# Patient Record
Sex: Female | Born: 1970 | Hispanic: Yes | Marital: Single | Smoking: Current every day smoker
Health system: Southern US, Community
[De-identification: ages and names within clinical notes are randomized; demographics above are authoritative.]

## PROBLEM LIST (undated history)

## (undated) DIAGNOSIS — R002 Palpitations: Secondary | ICD-10-CM

## (undated) DIAGNOSIS — K8050 Calculus of bile duct without cholangitis or cholecystitis without obstruction: Secondary | ICD-10-CM

## (undated) DIAGNOSIS — F410 Panic disorder [episodic paroxysmal anxiety] without agoraphobia: Secondary | ICD-10-CM

## (undated) DIAGNOSIS — K819 Cholecystitis, unspecified: Secondary | ICD-10-CM

## (undated) HISTORY — DX: Cholecystitis, unspecified: K81.9

## (undated) HISTORY — DX: Calculus of bile duct without cholangitis or cholecystitis without obstruction: K80.50

---

## 2016-07-10 DIAGNOSIS — R002 Palpitations: Secondary | ICD-10-CM

## 2016-07-10 HISTORY — DX: Palpitations: R00.2

## 2016-11-30 ENCOUNTER — Encounter: Payer: Self-pay | Admitting: Emergency Medicine

## 2016-11-30 ENCOUNTER — Observation Stay: Payer: Medicaid Other | Admitting: Anesthesiology

## 2016-11-30 ENCOUNTER — Emergency Department: Payer: Medicaid Other

## 2016-11-30 ENCOUNTER — Encounter
Admission: EM | Disposition: A | Discharge status: Home / Self Care | Payer: Self-pay | Source: Home / Self Care | Attending: Emergency Medicine

## 2016-11-30 ENCOUNTER — Observation Stay
Admission: EM | Admit: 2016-11-30 | Discharge: 2016-12-01 | Disposition: A | Discharge status: Home / Self Care | Payer: Medicaid Other | Attending: General Surgery | Admitting: General Surgery

## 2016-11-30 DIAGNOSIS — K819 Cholecystitis, unspecified: Secondary | ICD-10-CM | POA: Diagnosis not present

## 2016-11-30 DIAGNOSIS — K8010 Calculus of gallbladder with chronic cholecystitis without obstruction: Secondary | ICD-10-CM | POA: Diagnosis not present

## 2016-11-30 DIAGNOSIS — K8050 Calculus of bile duct without cholangitis or cholecystitis without obstruction: Secondary | ICD-10-CM

## 2016-11-30 HISTORY — DX: Cholecystitis, unspecified: K81.9

## 2016-11-30 HISTORY — DX: Palpitations: R00.2

## 2016-11-30 HISTORY — PX: CHOLECYSTECTOMY: SHX55

## 2016-11-30 LAB — CBC
HEMATOCRIT: 41.8 % (ref 35.0–47.0)
HEMOGLOBIN: 14.4 g/dL (ref 12.0–16.0)
MCH: 29.8 pg (ref 26.0–34.0)
MCHC: 34.4 g/dL (ref 32.0–36.0)
MCV: 86.7 fL (ref 80.0–100.0)
Platelets: 260 10*3/uL (ref 150–440)
RBC: 4.83 MIL/uL (ref 3.80–5.20)
RDW: 16.7 % — AB (ref 11.5–14.5)
WBC: 9.6 10*3/uL (ref 3.6–11.0)

## 2016-11-30 LAB — LIPASE, BLOOD: LIPASE: 22 U/L (ref 11–51)

## 2016-11-30 LAB — PREGNANCY, URINE: Preg Test, Ur: NEGATIVE

## 2016-11-30 LAB — HEPATIC FUNCTION PANEL
ALBUMIN: 4.1 g/dL (ref 3.5–5.0)
ALK PHOS: 67 U/L (ref 38–126)
ALT: 16 U/L (ref 14–54)
AST: 27 U/L (ref 15–41)
Bilirubin, Direct: <0.1 mg/dL — ABNORMAL LOW (ref 0.1–0.5)
TOTAL PROTEIN: 7.6 g/dL (ref 6.5–8.1)
Total Bilirubin: 0.6 mg/dL (ref 0.3–1.2)

## 2016-11-30 LAB — SURGICAL PCR SCREEN
MRSA, PCR: NEGATIVE
Staphylococcus aureus: POSITIVE — AB

## 2016-11-30 LAB — BASIC METABOLIC PANEL
ANION GAP: 8 (ref 5–15)
BUN: 7 mg/dL (ref 6–20)
CHLORIDE: 109 mmol/L (ref 101–111)
CO2: 22 mmol/L (ref 22–32)
Calcium: 9.1 mg/dL (ref 8.9–10.3)
Creatinine, Ser: 0.68 mg/dL (ref 0.44–1.00)
GFR calc Af Amer: >60 mL/min (ref >60)
GFR calc non Af Amer: >60 mL/min (ref >60)
GLUCOSE: 109 mg/dL — AB (ref 65–99)
POTASSIUM: 3.8 mmol/L (ref 3.5–5.1)
Sodium: 139 mmol/L (ref 135–145)

## 2016-11-30 LAB — TROPONIN I: Troponin I: <0.03 ng/mL (ref <0.03)

## 2016-11-30 SURGERY — LAPAROSCOPIC CHOLECYSTECTOMY
Anesthesia: General

## 2016-11-30 MED ORDER — DEXTROSE IN LACTATED RINGERS 5 % IV SOLN
INTRAVENOUS | Status: DC
  Administered 2016-11-30: 06:00:00 via INTRAVENOUS

## 2016-11-30 MED ORDER — OXYCODONE HCL 5 MG PO TABS: 5.0000 mg | ORAL_TABLET | ORAL | Status: DC | PRN

## 2016-11-30 MED ORDER — ONDANSETRON HCL 4 MG/2ML IJ SOLN: 4.0000 mg | Freq: Four times a day (QID) | INTRAMUSCULAR | Status: DC | PRN

## 2016-11-30 MED ORDER — ROCURONIUM BROMIDE 50 MG/5ML IV SOLN
INTRAVENOUS | Status: AC
  Filled 2016-11-30: qty 1

## 2016-11-30 MED ORDER — FENTANYL CITRATE (PF) 100 MCG/2ML IJ SOLN
INTRAMUSCULAR | Status: AC
  Administered 2016-11-30: 25 ug via INTRAVENOUS
  Filled 2016-11-30: qty 2

## 2016-11-30 MED ORDER — DEXAMETHASONE SODIUM PHOSPHATE 10 MG/ML IJ SOLN
INTRAMUSCULAR | Status: AC
  Filled 2016-11-30: qty 1

## 2016-11-30 MED ORDER — ONDANSETRON HCL 4 MG/2ML IJ SOLN
INTRAMUSCULAR | Status: DC | PRN
  Administered 2016-11-30: 4 mg via INTRAVENOUS

## 2016-11-30 MED ORDER — MORPHINE SULFATE (PF) 4 MG/ML IV SOLN
4.0000 mg | INTRAVENOUS | Status: DC | PRN
  Administered 2016-11-30: 4 mg via INTRAVENOUS
  Filled 2016-11-30: qty 1

## 2016-11-30 MED ORDER — HYDRALAZINE HCL 20 MG/ML IJ SOLN: 10.0000 mg | INTRAMUSCULAR | Status: DC | PRN

## 2016-11-30 MED ORDER — ONDANSETRON HCL 4 MG/2ML IJ SOLN
4.0000 mg | Freq: Once | INTRAMUSCULAR | Status: AC
  Administered 2016-11-30: 4 mg via INTRAVENOUS
  Filled 2016-11-30: qty 2

## 2016-11-30 MED ORDER — PROPOFOL 10 MG/ML IV BOLUS
INTRAVENOUS | Status: AC
  Filled 2016-11-30: qty 20

## 2016-11-30 MED ORDER — LIDOCAINE HCL (PF) 2 % IJ SOLN
INTRAMUSCULAR | Status: AC
  Filled 2016-11-30: qty 2

## 2016-11-30 MED ORDER — BUPIVACAINE-EPINEPHRINE 0.25% -1:200000 IJ SOLN
INTRAMUSCULAR | Status: DC | PRN
  Administered 2016-11-30: 20 mL

## 2016-11-30 MED ORDER — MUPIROCIN 2 % EX OINT
1.0000 "application " | TOPICAL_OINTMENT | Freq: Two times a day (BID) | CUTANEOUS | Status: DC
  Administered 2016-11-30 (×2): 1 via NASAL
  Filled 2016-11-30: qty 22

## 2016-11-30 MED ORDER — KETOROLAC TROMETHAMINE 30 MG/ML IJ SOLN
INTRAMUSCULAR | Status: DC | PRN
  Administered 2016-11-30: 30 mg via INTRAVENOUS

## 2016-11-30 MED ORDER — DEXAMETHASONE SODIUM PHOSPHATE 10 MG/ML IJ SOLN
INTRAMUSCULAR | Status: DC | PRN
  Administered 2016-11-30: 10 mg via INTRAVENOUS

## 2016-11-30 MED ORDER — ONDANSETRON 4 MG PO TBDP: 4.0000 mg | ORAL_TABLET | Freq: Four times a day (QID) | ORAL | Status: DC | PRN

## 2016-11-30 MED ORDER — MORPHINE SULFATE (PF) 4 MG/ML IV SOLN
4.0000 mg | Freq: Once | INTRAVENOUS | Status: DC
  Filled 2016-11-30: qty 1

## 2016-11-30 MED ORDER — ROCURONIUM BROMIDE 100 MG/10ML IV SOLN
INTRAVENOUS | Status: DC | PRN
  Administered 2016-11-30: 10 mg via INTRAVENOUS
  Administered 2016-11-30: 40 mg via INTRAVENOUS

## 2016-11-30 MED ORDER — SUGAMMADEX SODIUM 200 MG/2ML IV SOLN
INTRAVENOUS | Status: DC | PRN
  Administered 2016-11-30: 180 mg via INTRAVENOUS

## 2016-11-30 MED ORDER — PROPOFOL 10 MG/ML IV BOLUS
INTRAVENOUS | Status: DC | PRN
  Administered 2016-11-30: 150 mg via INTRAVENOUS

## 2016-11-30 MED ORDER — FENTANYL CITRATE (PF) 100 MCG/2ML IJ SOLN
INTRAMUSCULAR | Status: AC
  Filled 2016-11-30: qty 2

## 2016-11-30 MED ORDER — ONDANSETRON HCL 4 MG/2ML IJ SOLN
INTRAMUSCULAR | Status: AC
  Filled 2016-11-30: qty 2

## 2016-11-30 MED ORDER — LACTATED RINGERS IV SOLN
INTRAVENOUS | Status: DC
  Administered 2016-11-30: 100 mL/h via INTRAVENOUS

## 2016-11-30 MED ORDER — DEXTROSE 5 % IV SOLN
2.0000 g | INTRAVENOUS | Status: DC
  Administered 2016-11-30 – 2016-12-01 (×2): 2 g via INTRAVENOUS
  Filled 2016-11-30 (×2): qty 2

## 2016-11-30 MED ORDER — SUGAMMADEX SODIUM 200 MG/2ML IV SOLN
INTRAVENOUS | Status: AC
  Filled 2016-11-30: qty 2

## 2016-11-30 MED ORDER — FENTANYL CITRATE (PF) 100 MCG/2ML IJ SOLN
25.0000 ug | INTRAMUSCULAR | Status: DC | PRN
  Administered 2016-11-30 (×4): 25 ug via INTRAVENOUS

## 2016-11-30 MED ORDER — CHLORHEXIDINE GLUCONATE CLOTH 2 % EX PADS
6.0000 | MEDICATED_PAD | Freq: Every day | CUTANEOUS | Status: DC
  Administered 2016-11-30: 6 via TOPICAL

## 2016-11-30 MED ORDER — DIPHENHYDRAMINE HCL 50 MG/ML IJ SOLN: 12.5000 mg | Freq: Four times a day (QID) | INTRAMUSCULAR | Status: DC | PRN

## 2016-11-30 MED ORDER — KETOROLAC TROMETHAMINE 30 MG/ML IJ SOLN
30.0000 mg | Freq: Four times a day (QID) | INTRAMUSCULAR | Status: DC
  Administered 2016-11-30 – 2016-12-01 (×4): 30 mg via INTRAVENOUS
  Filled 2016-11-30 (×4): qty 1

## 2016-11-30 MED ORDER — CEFAZOLIN SODIUM-DEXTROSE 2-4 GM/100ML-% IV SOLN: 2.0000 g | Freq: Once | INTRAVENOUS | Status: DC

## 2016-11-30 MED ORDER — MORPHINE SULFATE (PF) 4 MG/ML IV SOLN
4.0000 mg | Freq: Once | INTRAVENOUS | Status: AC
  Administered 2016-11-30: 4 mg via INTRAVENOUS
  Filled 2016-11-30: qty 1

## 2016-11-30 MED ORDER — ONDANSETRON HCL 4 MG/2ML IJ SOLN: 4.0000 mg | Freq: Once | INTRAMUSCULAR | Status: DC | PRN

## 2016-11-30 MED ORDER — HYDROMORPHONE HCL 1 MG/ML IJ SOLN: 0.5000 mg | INTRAMUSCULAR | Status: DC | PRN

## 2016-11-30 MED ORDER — MIDAZOLAM HCL 2 MG/2ML IJ SOLN
INTRAMUSCULAR | Status: AC
  Filled 2016-11-30: qty 2

## 2016-11-30 MED ORDER — BUPIVACAINE-EPINEPHRINE (PF) 0.25% -1:200000 IJ SOLN
INTRAMUSCULAR | Status: AC
  Filled 2016-11-30: qty 30

## 2016-11-30 MED ORDER — KETOROLAC TROMETHAMINE 30 MG/ML IJ SOLN
INTRAMUSCULAR | Status: AC
  Filled 2016-11-30: qty 1

## 2016-11-30 MED ORDER — DIPHENHYDRAMINE HCL 12.5 MG/5ML PO ELIX: 12.5000 mg | ORAL_SOLUTION | Freq: Four times a day (QID) | ORAL | Status: DC | PRN

## 2016-11-30 MED ORDER — PANTOPRAZOLE SODIUM 40 MG IV SOLR
40.0000 mg | Freq: Every day | INTRAVENOUS | Status: DC
  Administered 2016-11-30: 40 mg via INTRAVENOUS
  Filled 2016-11-30: qty 40

## 2016-11-30 MED ORDER — MIDAZOLAM HCL 2 MG/2ML IJ SOLN
INTRAMUSCULAR | Status: DC | PRN
  Administered 2016-11-30: 2 mg via INTRAVENOUS

## 2016-11-30 MED ORDER — CEFAZOLIN SODIUM-DEXTROSE 2-4 GM/100ML-% IV SOLN
INTRAVENOUS | Status: AC
  Administered 2016-11-30: 2000 mg
  Filled 2016-11-30: qty 100

## 2016-11-30 MED ORDER — FENTANYL CITRATE (PF) 100 MCG/2ML IJ SOLN
INTRAMUSCULAR | Status: DC | PRN
  Administered 2016-11-30 (×4): 50 ug via INTRAVENOUS

## 2016-11-30 SURGICAL SUPPLY — 43 items
APPLIER CLIP 5 13 M/L LIGAMAX5 (MISCELLANEOUS) ×3
BLADE SURG 15 STRL LF DISP TIS (BLADE) ×1 IMPLANT
BLADE SURG 15 STRL SS (BLADE) ×2
CANISTER SUCT 1200ML W/VALVE (MISCELLANEOUS) ×3 IMPLANT
CATH CHOLANGI 4FR 420404F (CATHETERS) IMPLANT
CHLORAPREP W/TINT 26ML (MISCELLANEOUS) ×3 IMPLANT
CLIP APPLIE 5 13 M/L LIGAMAX5 (MISCELLANEOUS) ×1 IMPLANT
CONRAY 60ML FOR OR (MISCELLANEOUS) ×3 IMPLANT
DERMABOND ADVANCED (GAUZE/BANDAGES/DRESSINGS) ×2
DERMABOND ADVANCED .7 DNX12 (GAUZE/BANDAGES/DRESSINGS) ×1 IMPLANT
DRAPE C-ARM XRAY 36X54 (DRAPES) IMPLANT
ELECT REM PT RETURN 9FT ADLT (ELECTROSURGICAL) ×3
ELECTRODE REM PT RTRN 9FT ADLT (ELECTROSURGICAL) ×1 IMPLANT
ENDOPOUCH RETRIEVER 10 (MISCELLANEOUS) ×3 IMPLANT
FILTER LAP SMOKE EVAC STRL (MISCELLANEOUS) ×3 IMPLANT
GLOVE SURG SYN 7.0 (GLOVE) ×3 IMPLANT
GLOVE SURG SYN 7.5  E (GLOVE) ×2
GLOVE SURG SYN 7.5 E (GLOVE) ×1 IMPLANT
GOWN STRL REUS W/ TWL LRG LVL3 (GOWN DISPOSABLE) ×3 IMPLANT
GOWN STRL REUS W/TWL LRG LVL3 (GOWN DISPOSABLE) ×6
IRRIGATION STRYKERFLOW (MISCELLANEOUS) IMPLANT
IRRIGATOR STRYKERFLOW (MISCELLANEOUS)
IV CATH ANGIO 12GX3 LT BLUE (NEEDLE) ×3 IMPLANT
IV NS 1000ML (IV SOLUTION)
IV NS 1000ML BAXH (IV SOLUTION) IMPLANT
JACKSON PRATT 10 (INSTRUMENTS) IMPLANT
L-HOOK LAP DISP 36CM (ELECTROSURGICAL) ×3
LABEL OR SOLS (LABEL) ×3 IMPLANT
LHOOK LAP DISP 36CM (ELECTROSURGICAL) ×1 IMPLANT
NDL SAFETY 22GX1.5 (NEEDLE) ×3 IMPLANT
NEEDLE HYPO 22GX1.5 SAFETY (NEEDLE) ×3 IMPLANT
PACK LAP CHOLECYSTECTOMY (MISCELLANEOUS) ×3 IMPLANT
PENCIL ELECTRO HAND CTR (MISCELLANEOUS) ×3 IMPLANT
SCISSORS METZENBAUM CVD 33 (INSTRUMENTS) ×3 IMPLANT
SLEEVE ADV FIXATION 5X100MM (TROCAR) ×9 IMPLANT
SPONGE VERSALON 4X4 4PLY (MISCELLANEOUS) IMPLANT
SUT MNCRL 4-0 (SUTURE) ×2
SUT MNCRL 4-0 27XMFL (SUTURE) ×1
SUT VICRYL 0 AB UR-6 (SUTURE) ×3 IMPLANT
SUTURE MNCRL 4-0 27XMF (SUTURE) ×1 IMPLANT
TROCAR 130MM GELPORT  DAV (MISCELLANEOUS) ×3 IMPLANT
TROCAR Z-THREAD OPTICAL 5X100M (TROCAR) ×3 IMPLANT
TUBING INSUFFLATOR HI FLOW (MISCELLANEOUS) ×3 IMPLANT

## 2016-11-30 NOTE — Anesthesia Procedure Notes (Signed)
Procedure Name: Intubation Date/Time: 11/30/2016 12:35 PM Performed by: Aline Brochure Pre-anesthesia Checklist: Patient identified, Emergency Drugs available, Suction available and Patient being monitored Patient Re-evaluated:Patient Re-evaluated prior to inductionOxygen Delivery Method: Circle system utilized Preoxygenation: Pre-oxygenation with 100% oxygen Intubation Type: IV induction Ventilation: Mask ventilation without difficulty Laryngoscope Size: Mac and 3 Grade View: Grade I Tube type: Oral Tube size: 7.0 mm Number of attempts: 1 Placement Confirmation: ETT inserted through vocal cords under direct vision,  positive ETCO2 and breath sounds checked- equal and bilateral Secured at: 22 cm Tube secured with: Tape Dental Injury: Teeth and Oropharynx as per pre-operative assessment

## 2016-11-30 NOTE — Anesthesia Post-op Follow-up Note (Cosign Needed)
Anesthesia QCDR form completed.        

## 2016-11-30 NOTE — H&P (Signed)
Patient ID: Cathy Woods, female   DOB: 1971-03-14, 46 y.o.   MRN: [id number]  CC: ABDOMINAL PAIN  HPI Cathy Woods is a 46 y.o. female who presents to the emergency department with a one-day history of abdominal pain. Patient reports this is happen before and appears to be associated with overeating. She reports awakening with right upper quadrant pain. It has been severe in nature with a stabbing sensation. It has not radiated removed since it started. He continued to progress during the day which prompted her to seek medical care in the emergency department. She has had nausea but no vomiting with this. She has had subjective chills but no fevers. She denies chest pain, shortness of breath, diarrhea, constipation. She is otherwise in her usual state of health.  HPI  History reviewed. No pertinent past medical history. Patient currently on no medications.  History reviewed. No pertinent surgical history. Patient denies any prior abdominal history.  History reviewed. No pertinent family history. Patient reports diabetes in the distant family but denies any heart disease or cancer  Social History Social History  Substance Use Topics  . Smoking status: Never Smoker  . Smokeless tobacco: Never Used  . Alcohol use No    No Known Allergies  No current facility-administered medications for this encounter.    No current outpatient prescriptions on file.     Review of Systems A Multi-point review of systems was asked and was negative except for the findings documented in the history of present illness  Physical Exam Blood pressure 127/84, pulse 66, temperature 97.6 F (36.4 C), temperature source Oral, resp. rate 14, height 5\' 6"  (1.676 m), weight 86.2 kg (190 lb), last menstrual period 11/29/2016, SpO2 100 %. CONSTITUTIONAL: No acute distress. EYES: Pupils are equal, round, and reactive to light, Sclera are non-icteric. EARS, NOSE, MOUTH AND THROAT: The oropharynx is clear. The  oral mucosa is pink and moist. Hearing is intact to voice. LYMPH NODES:  Lymph nodes in the neck are normal. RESPIRATORY:  Lungs are clear. There is normal respiratory effort, with equal breath sounds bilaterally, and without pathologic use of accessory muscles. CARDIOVASCULAR: Heart is regular without murmurs, gallops, or rubs. GI: The abdomen is soft, tender to palpation in the right upper quadrant with a positive Murphy's sign, and nondistended. There are no palpable masses. There is no hepatosplenomegaly. There are normal bowel sounds in all quadrants. GU: Rectal deferred.   MUSCULOSKELETAL: Normal muscle strength and tone. No cyanosis or edema.   SKIN: Turgor is good and there are no pathologic skin lesions or ulcers. NEUROLOGIC: Motor and sensation is grossly normal. Cranial nerves are grossly intact. PSYCH:  Oriented to person, place and time. Affect is normal.  Data Reviewed Images and labs reviewed. Labs are all within normal limits to include a normal white blood cell count 9.6, normal LFTs with a bilirubin of 0.6, lipase, AST, ALT all within normal limits. Right upper quadrant ultrasound shows a 1.3 cm stone in the neck of the gallbladder. Questionable thickening of the gallbladder wall but no pericholecystic fluid or ductal dilatation. Positive Murphy sign on ultrasound. I have personally reviewed the patient's imaging, laboratory findings and medical records.    Assessment    Cholecystitis    Plan    46 year old female with concerning features of cholecystitis. Discussed the diagnosis in detail with the patient. Discussed treatment options of antibiotics, surgery, accommodation of both. Also discussed that timing of surgery would be dependent upon 04 and surgeon availability. She voiced  understanding. Plan to bring the patient and her under observation. Likely to undergo a laparoscopic cholecystectomy later today, likely with my partner Dr. Aleen CampiPiscoya. Patient voiced understanding and  accepts this plan.     Time spent with the patient was 45 minutes, with more than 50% of the time spent in face-to-face education, counseling and care coordination.     Ricarda Frameharles Lindy Pennisi, MD FACS General Surgeon 11/30/2016, 4:30 AM

## 2016-11-30 NOTE — ED Triage Notes (Signed)
Pt ambulatory to triage with steady slow gait, no distress noted. Pt c/o of right sided chest pain at rib area. Pt reports having same symptoms 1 week ago and pain went away, today pain has increased and is not going away. Pt denies N/V/SOB.

## 2016-11-30 NOTE — Anesthesia Preprocedure Evaluation (Signed)
Anesthesia Evaluation  Patient identified by MRN, date of birth, ID band Patient awake    Reviewed: Allergy & Precautions, NPO status , Patient's Chart, lab work & pertinent test results  History of Anesthesia Complications Negative for: history of anesthetic complications  Airway Mallampati: II       Dental   Pulmonary neg pulmonary ROS,           Cardiovascular negative cardio ROS       Neuro/Psych negative neurological ROS     GI/Hepatic negative GI ROS, Neg liver ROS,   Endo/Other  diabetes, Gestational  Renal/GU negative Renal ROS     Musculoskeletal   Abdominal   Peds  Hematology   Anesthesia Other Findings   Reproductive/Obstetrics                             Anesthesia Physical Anesthesia Plan  ASA: II  Anesthesia Plan: General   Post-op Pain Management:    Induction: Intravenous  Airway Management Planned: Oral ETT  Additional Equipment:   Intra-op Plan:   Post-operative Plan:   Informed Consent: I have reviewed the patients History and Physical, chart, labs and discussed the procedure including the risks, benefits and alternatives for the proposed anesthesia with the patient or authorized representative who has indicated his/her understanding and acceptance.     Plan Discussed with:   Anesthesia Plan Comments:         Anesthesia Quick Evaluation

## 2016-11-30 NOTE — Transfer of Care (Signed)
Immediate Anesthesia Transfer of Care Note  Patient: Tana CoastNoemy Record  Procedure(s) Performed: Procedure(s): LAPAROSCOPIC CHOLECYSTECTOMY (N/A)  Patient Location: PACU  Anesthesia Type:General  Level of Consciousness: sedated and responds to stimulation  Airway & Oxygen Therapy: Patient Spontanous Breathing and Patient connected to face mask oxygen  Post-op Assessment: Report given to RN and Post -op Vital signs reviewed and stable  Post vital signs: Reviewed and stable  Last Vitals:  Vitals:   11/30/16 1157 11/30/16 1408  BP: 124/79 (!) 102/59  Pulse: 64 74  Resp: 15 16  Temp: (!) 35.7 C 36.3 C    Last Pain:  Vitals:   11/30/16 1157  TempSrc: Tympanic  PainSc: 6       Patients Stated Pain Goal: 1 (11/30/16 1157)  Complications: No apparent anesthesia complications

## 2016-11-30 NOTE — Progress Notes (Signed)
11/30/2016  Subjective: Patient admitted overnight with unrelenting pain from biliary colic. There is no signs of cholecystitis on ultrasound or her labs but she did have a positive Murphy's sign and her pain was not improving and she continued to have nausea. This morning she reports that her pain is a little bit better and has not had nausea since last night.  Vital signs: Temp:  [97.6 F (36.4 C)-98.4 F (36.9 C)] 98.4 F (36.9 C) (05/24 0740) Pulse Rate:  [54-76] 54 (05/24 0740) Resp:  [11-20] 14 (05/24 0740) BP: (123-128)/(64-84) 123/64 (05/24 0740) SpO2:  [98 %-100 %] 100 % (05/24 0740) Weight:  [86.2 kg (190 lb)-88.5 kg (195 lb 0.2 oz)] 88.5 kg (195 lb 0.2 oz) (05/24 0510)   Intake/Output: No intake/output data recorded. Last BM Date: 11/29/16  Physical Exam: Constitutional: No acute distress Abdomen:  Soft, nondistended, with positive Murphy's sign and tenderness to palpation in the right upper quadrant and epigastric area.  Labs:   Recent Labs  11/30/16 0122  WBC 9.6  HGB 14.4  HCT 41.8  PLT 260    Recent Labs  11/30/16 0122  NA 139  K 3.8  CL 109  CO2 22  GLUCOSE 109*  BUN 7  CREATININE 0.68  CALCIUM 9.1   No results for input(s): LABPROT, INR in the last 72 hours.  Imaging: Dg Chest 2 View  Result Date: 11/30/2016 CLINICAL DATA:  Right-sided chest pain at the rib area. Similar symptoms 1 week ago. Today pain is increasing and not going away. Smoker. EXAM: CHEST  2 VIEW COMPARISON:  None. FINDINGS: The heart size and mediastinal contours are within normal limits. Both lungs are clear. The visualized skeletal structures are unremarkable. IMPRESSION: No active cardiopulmonary disease. Electronically Signed   By: Burman NievesWilliam  Stevens M.D.   On: 11/30/2016 01:53   [place] Abdomen Limited Ruq  Result Date: 11/30/2016 CLINICAL DATA:  Acute onset of right upper quadrant abdominal pain. Initial encounter. EXAM: US ABDOMEN LIMITED - RIGHT UPPER QUADRANT COMPARISON:   None. FINDINGS: Gallbladder: A 1.3 cm stone is lodged at the neck of the gallbladder. Mild gallbladder wall thickening is noted, measuring 3 mm. A positive ultrasonographic Murphy's sign is elicited, raising concern for intermittent obstruction due to the stone. No pericholecystic fluid is seen. Common bile duct: Diameter: 0.5 cm, within normal limits in caliber. Liver: No focal lesion identified. Within normal limits in parenchymal echogenicity. IMPRESSION: 1.3 cm stone lodged at the neck of the gallbladder. Mild gallbladder wall thickening noted. Positive ultrasonographic Murphy's sign raises concern for intermittent obstruction due to the stone. No definite evidence for acute cholecystitis. Electronically Signed   By: Roanna RaiderJeffery  Chang M.D.   On: 11/30/2016 03:44    Assessment/Plan: 46 year old female with symptomatic cholelithiasis with unrelenting pain.   -We'll take the patient to the operating room today for laparoscopic cholecystectomy. Discussed the risks of the procedure including risk of bleeding, infection, abscess formation, injury to surrounding structures as well as need for further procedures. The patient is willing to proceed and has given informed consent. The patient will go to the operating room today pending or availability. In the meantime she will continue to be nothing by mouth with IV fluid hydration and IV antibiotics and appropriate pain and nausea control.   Howie IllJose Luis Desmond Szabo, MD [facility]

## 2016-11-30 NOTE — Op Note (Signed)
  Procedure Date:  11/30/2016  Pre-operative Diagnosis:  Symptomatic cholelithiasis  Post-operative Diagnosis: Chronic cholecystitis  Procedure:  Laparoscopic cholecystectomy  Surgeon:  Howie IllJose Luis Lajuanna Pompa, MD  Anesthesia:  General endotracheal  Estimated Blood Loss:  10 ml  Specimens:  gallbladder  Complications:  None  Indications for Procedure:  This is a 46 y.o. female who presents with abdominal pain and workup revealing symptomatic cholelithiasis.  The benefits, complications, treatment options, and expected outcomes were discussed with the patient. The risks of bleeding, infection, recurrence of symptoms, failure to resolve symptoms, bile duct damage, bile duct leak, retained common bile duct stone, bowel injury, and need for further procedures were all discussed with the patient and she was willing to proceed.  Description of Procedure: The patient was correctly identified in the preoperative area and brought into the operating room.  The patient was placed supine with VTE prophylaxis in place.  Appropriate time-outs were performed.  Anesthesia was induced and the patient was intubated.  Appropriate antibiotics were infused.  The abdomen was prepped and draped in a sterile fashion. An infraumbilical incision was made. A cutdown technique was used to enter the abdominal cavity without injury, and a Hasson trocar was inserted.  Pneumoperitoneum was obtained with appropriate opening pressures.  A 5-mm port was placed in the subxiphoid area and two 5-mm ports were placed in the right upper quadrant under direct visualization.  The gallbladder was identified and found to be inflamed.  The fundus was grasped and retracted cephalad.  Adhesions were lysed bluntly and with electrocautery. The infundibulum was grasped and retracted laterally, exposing the peritoneum overlying the gallbladder.  This was incised with electrocautery and extended on either side of the gallbladder.  The cystic duct  and anterior and posterior cystic artery were clearly identified and bluntly dissected.  All were clipped twice proximally and once distally, cutting in between.  The gallbladder was taken from the gallbladder fossa in a retrograde fashion with electrocautery. The gallbladder was placed in an Endocatch bag and brought out via the umbilical incision. The liver bed was inspected and any bleeding was controlled with electrocautery. The right upper quadrant was then inspected again revealing intact clips, no bleeding, and no ductal injury.  The area was thoroughly irrigated.  The 5 mm ports were removed under direct visualization and the Hasson trocar was removed.  The fascial opening was closed using 0 vicryl suture.  Local anesthetic was infused in all incisions and the incisions were closed with 4-0 Monocryl.  The wounds were cleaned and sealed with DermaBond.  The patient was emerged from anesthesia and extubated and brought to the recovery room for further management.  The patient tolerated the procedure well and all counts were correct at the end of the case.   Howie IllJose Luis Merry Pond, MD

## 2016-11-30 NOTE — Anesthesia Postprocedure Evaluation (Signed)
Anesthesia Post Note  Patient: Cathy Woods  Procedure(s) Performed: Procedure(s) (LRB): LAPAROSCOPIC CHOLECYSTECTOMY (N/A)  Patient location during evaluation: PACU Anesthesia Type: General Level of consciousness: awake and alert Pain management: pain level controlled Vital Signs Assessment: post-procedure vital signs reviewed and stable Respiratory status: spontaneous breathing and respiratory function stable Cardiovascular status: stable Anesthetic complications: no     Last Vitals:  Vitals:   11/30/16 1505 11/30/16 1512  BP: 102/66 (!) 104/59  Pulse: 63 (!) 52  Resp: 17 14  Temp:      Last Pain:  Vitals:   11/30/16 1500  TempSrc:   PainSc: 2                  KEPHART,WILLIAM K

## 2016-11-30 NOTE — ED Provider Notes (Signed)
[facility] Emergency Department Provider Note  Time seen: 3:53 AM  I have reviewed the triage vital signs and the nursing notes.   HISTORY  Chief Complaint Chest Pain    HPI Cathy Woods is a 46 y.o. female with no past medical history who presents to the emergency department with right upper quadrant abdominal pain. According to the patient approximately one week ago she developed pain in the right upper quadrant and the abdomen lasted several hours and then went away. States she has been pain-free until this morning when she awoke with mild right upper quadrant pain which had progressively worsened throughout the day became severe this evening along with nausea associated with the emergency department for evaluation. Denies any fever. Denies any vomiting. Rates the pain as moderate to severe. Denies any pain in the chest or trouble breathing.  History reviewed. No pertinent past medical history.  There are no active problems to display for this patient.   History reviewed. No pertinent surgical history.  Prior to Admission medications   Not on File    No Known Allergies  History reviewed. No pertinent family history.  Social History Social History  Substance Use Topics  . Smoking status: Never Smoker  . Smokeless tobacco: Never Used  . Alcohol use No    Review of Systems Constitutional: Negative for fever. Cardiovascular: Negative for chest pain. Respiratory: Negative for shortness of breath. Gastrointestinal:Moderate abdominal pain. Positive for nausea. Genitourinary: Negative for dysuria. Musculoskeletal: Negative for back pain. Skin: Negative for rash. Neurological: Negative for headache All other ROS negative  ____________________________________________   PHYSICAL EXAM:  VITAL SIGNS: ED Triage Vitals [11/30/16 0118]  Enc Vitals Group     BP 128/81     Pulse Rate 76     Resp 17     Temp 97.6 F (36.4 C)     Temp Source  Oral     SpO2 99 %     Weight 190 lb (86.2 kg)     Height 5\' 6"  (1.676 m)     Head Circumference      Peak Flow      Pain Score      Pain Loc      Pain Edu?      Excl. in GC?     Constitutional: Alert and oriented. Well appearing and in no distress. Eyes: Normal exam ENT   Head: Normocephalic and atraumatic.   Mouth/Throat: Mucous membranes are moist. Cardiovascular: Normal rate, regular rhythm. No murmur Respiratory: Normal respiratory effort without tachypnea nor retractions. Breath sounds are clear  Gastrointestinal: Soft, moderate right upper quadrant tenderness palpation. No rebound or guarding. No distention. Musculoskeletal: Nontender with normal range of motion in all extremities.  Neurologic:  Normal speech and language. No gross focal neurologic deficits  Skin:  Skin is warm, dry and intact.  Psychiatric: Mood and affect are normal.   ____________________________________________    EKG  EKG reviewed and interpreted by myself shows sinus rhythm at 81 bpm, narrow QRS, normal axis, normal intervals, nonspecific ST changes without elevation.  ____________________________________________    RADIOLOGY  Chest x-ray negative. Ultrasound shows a 1.3 cm stone lodged in the neck of the gallbladder.  ____________________________________________   INITIAL IMPRESSION / ASSESSMENT AND PLAN / ED COURSE  Pertinent labs & imaging results that were available during my care of the patient were reviewed by me and considered in my medical decision making (see chart for details).  Patient presents with right upper quadrant abdominal  pain. Patient has moderate tenderness palpation in the right upper quadrant, mild distress due to pain. Labs including LFTs and lipase are normal. Troponin negative. Ultrasound of the right upper quadrant shows a 1.3 cm stone lodged in the gallbladder neck. I discussed the patient with general surgery who will be down to see the patient.  Dr.  Chrissie Noa has seen the patient will be admitting to his service for symptomatic biliary colic  ____________________________________________   FINAL CLINICAL IMPRESSION(S) / ED DIAGNOSES  Right upper quadrant pain Biliary colic   Minna Antis, MD 11/30/16 [phone]

## 2016-12-01 LAB — HIV ANTIBODY (ROUTINE TESTING W REFLEX): HIV Screen 4th Generation wRfx: NONREACTIVE

## 2016-12-01 MED ORDER — OXYCODONE HCL 5 MG PO TABS: 5.0000 mg | ORAL_TABLET | ORAL | 0 refills | Status: DC | PRN

## 2016-12-01 NOTE — Discharge Instructions (Signed)
Laparoscopic Cholecystectomy, Care After °This sheet gives you information about how to care for yourself after your procedure. Your health care provider may also give you more specific instructions. If you have problems or questions, contact your health care provider. °What can I expect after the procedure? °After the procedure, it is common to have: °· Pain at your incision sites. You will be given medicines to control this pain. °· Mild nausea or vomiting. °· Bloating and possible shoulder pain from the air-like gas that was used during the procedure. °Follow these instructions at home: °Incision care  ° °· Follow instructions from your health care provider about how to take care of your incisions. Make sure you: °¨ Wash your hands with soap and water before you change your bandage (dressing). If soap and water are not available, use hand sanitizer. °¨ Change your dressing as told by your health care provider. °¨ Leave stitches (sutures), skin glue, or adhesive strips in place. These skin closures may need to be in place for 2 weeks or longer. If adhesive strip edges start to loosen and curl up, you may trim the loose edges. Do not remove adhesive strips completely unless your health care provider tells you to do that. °· Do not take baths, swim, or use a hot tub until your health care provider approves. Ask your health care provider if you can take showers. You may only be allowed to take sponge baths for bathing. °· Check your incision area every day for signs of infection. Check for: °¨ More redness, swelling, or pain. °¨ More fluid or blood. °¨ Warmth. °¨ Pus or a bad smell. °Activity  °· Do not drive or use heavy machinery while taking prescription pain medicine. °· Do not lift anything that is heavier than 10 lb (4.5 kg) until your health care provider approves. °· Do not play contact sports until your health care provider approves. °· Do not drive for 24 hours if you were given a medicine to help you relax  (sedative). °· Rest as needed. Do not return to work or school until your health care provider approves. °General instructions  °· Take over-the-counter and prescription medicines only as told by your health care provider. °· To prevent or treat constipation while you are taking prescription pain medicine, your health care provider may recommend that you: °¨ Drink enough fluid to keep your urine clear or pale yellow. °¨ Take over-the-counter or prescription medicines. °¨ Eat foods that are high in fiber, such as fresh fruits and vegetables, whole grains, and beans. °¨ Limit foods that are high in fat and processed sugars, such as fried and sweet foods. °Contact a health care provider if: °· You develop a rash. °· You have more redness, swelling, or pain around your incisions. °· You have more fluid or blood coming from your incisions. °· Your incisions feel warm to the touch. °· You have pus or a bad smell coming from your incisions. °· You have a fever. °· One or more of your incisions breaks open. °Get help right away if: °· You have trouble breathing. °· You have chest pain. °· You have increasing pain in your shoulders. °· You faint or feel dizzy when you stand. °· You have severe pain in your abdomen. °· You have nausea or vomiting that lasts for more than one day. °· You have leg pain. °This information is not intended to replace advice given to you by your health care provider. Make sure you discuss any   questions you have with your health care provider. °Document Released: 06/26/2005 Document Revised: 01/15/2016 Document Reviewed: 12/13/2015 °[employer] Interactive Patient Education © 2017 [employer] ° °

## 2016-12-01 NOTE — Discharge Summary (Signed)
Patient ID: Cathy Woods MRN: [id number] DOB/AGE: 1971-02-02 46 y.o.  Admit date: 11/30/2016 Discharge date: 12/01/2016  Discharge Diagnoses:  Cholecystitis  Procedures Performed: Laparoscopic cholecystectomy  Discharged Condition: good  Hospital Course: Patient brought in from ER with cholecystitis. Underwent uneventful laparoscopic cholecystectomy. On day of discharge her abdomen was benign and nontender. No evidence of infection. Tolerating a diet.  Discharge Orders: Discharge Instructions    Call MD for:  difficulty breathing, headache or visual disturbances    Complete by:  As directed    Call MD for:  persistant nausea and vomiting    Complete by:  As directed    Call MD for:  redness, tenderness, or signs of infection (pain, swelling, redness, odor or green/yellow discharge around incision site)    Complete by:  As directed    Call MD for:  severe uncontrolled pain    Complete by:  As directed    Call MD for:  temperature >100.4    Complete by:  As directed    Diet - low sodium heart healthy    Complete by:  As directed    Increase activity slowly    Complete by:  As directed       Disposition: Final discharge disposition not confirmed  Discharge Medications: Allergies as of 12/01/2016      Reactions   Bactrim [sulfamethoxazole-trimethoprim] Hives      Medication List    TAKE these medications   multivitamin capsule Take 1 capsule by mouth daily.   oxyCODONE 5 MG immediate release tablet Commonly known as:  Oxy IR/ROXICODONE Take 1-2 tablets (5-10 mg total) by mouth every 4 (four) hours as needed for moderate pain or severe pain.   simethicone 80 MG chewable tablet Commonly known as:  MYLICON Chew 80 mg by mouth every 6 (six) hours as needed for flatulence.        Follwup: Follow-up Information    [facility] [place]).   Specialty:  General Surgery Why:  postop Contact information: [address] [place] [id number] [phone]          Signed: Ricarda FrameCharles Debbera Wolken 12/01/2016, 8:17 AM

## 2016-12-01 NOTE — Progress Notes (Signed)
Patent discharged to home as ordered. IV discontinued site clean dry and intact. Patient is alert and oriented, ambulates well without difficulty, no acute distress noted. Patient daughter at the bedside to take patient home patient given prescription as ordered. Follow up appointment given as ordered.

## 2016-12-05 LAB — SURGICAL PATHOLOGY

## 2016-12-06 ENCOUNTER — Telehealth: Payer: Self-pay

## 2016-12-06 NOTE — Telephone Encounter (Signed)
Post-op call made to patient at this time. Left message for patient telling her that I was calling to see how she was doing since being discharged from the hospital. I said that she is welcome to call back and let us know how she is feeling prior to her appointment on Monday.

## 2016-12-11 ENCOUNTER — Encounter: Payer: Self-pay | Admitting: Surgery

## 2016-12-11 ENCOUNTER — Ambulatory Visit (INDEPENDENT_AMBULATORY_CARE_PROVIDER_SITE_OTHER): Payer: Medicaid Other | Admitting: Surgery

## 2016-12-11 VITALS — BP 131/84 | HR 87 | Temp 98.7°F | Ht 66.0 in | Wt 196.0 lb

## 2016-12-11 DIAGNOSIS — K8010 Calculus of gallbladder with chronic cholecystitis without obstruction: Secondary | ICD-10-CM

## 2016-12-11 NOTE — Patient Instructions (Signed)
Please call our office if you have questions or concerns.   

## 2016-12-11 NOTE — Progress Notes (Signed)
Outpatient postop visit  12/11/2016  Tana Coastoemy Raisch is an 46 y.o. female.    Procedure: Laparoscopic cholecystectomy  CC: No pain  HPI patient has no complaints at this time tolerating a regular diet. She does not eat meat.  Medications reviewed.    Physical Exam:  BP 131/84   Pulse 87   Temp 98.7 F (37.1 C) (Oral)   Ht 5\' 6"  (1.676 m)   Wt 196 lb (88.9 kg)   LMP 11/29/2016 (Exact Date)   BMI 31.64 kg/m     PE: No icterus no jaundice abdomen soft nontender wounds healing well no erythema no drainage    Assessment/Plan:  Patient doing very well following laparoscopic cholecystectomy. Pathology confirmed chronic cholecystitis without malignancy. Patient doing very well recommend follow up on an as-needed basis  Lattie Hawichard E Chenay Nesmith, MD, FACS

## 2016-12-26 ENCOUNTER — Telehealth: Payer: Self-pay

## 2016-12-26 NOTE — Telephone Encounter (Signed)
error 

## 2017-09-19 ENCOUNTER — Encounter: Payer: Self-pay | Admitting: Emergency Medicine

## 2017-09-19 ENCOUNTER — Emergency Department
Admission: EM | Admit: 2017-09-19 | Discharge: 2017-09-19 | Disposition: A | Discharge status: Home / Self Care | Payer: Medicaid Other | Attending: Emergency Medicine | Admitting: Emergency Medicine

## 2017-09-19 DIAGNOSIS — M25512 Pain in left shoulder: Secondary | ICD-10-CM | POA: Diagnosis present

## 2017-09-19 DIAGNOSIS — M5412 Radiculopathy, cervical region: Secondary | ICD-10-CM | POA: Insufficient documentation

## 2017-09-19 DIAGNOSIS — F17200 Nicotine dependence, unspecified, uncomplicated: Secondary | ICD-10-CM | POA: Diagnosis not present

## 2017-09-19 MED ORDER — PREDNISONE 10 MG PO TABS: ORAL_TABLET | ORAL | 0 refills | Status: AC

## 2017-09-19 MED ORDER — KETOROLAC TROMETHAMINE 30 MG/ML IJ SOLN
30.0000 mg | Freq: Once | INTRAMUSCULAR | Status: AC
  Administered 2017-09-19: 30 mg via INTRAMUSCULAR
  Filled 2017-09-19: qty 1

## 2017-09-19 MED ORDER — TRAMADOL HCL 50 MG PO TABS: 50.0000 mg | ORAL_TABLET | Freq: Four times a day (QID) | ORAL | 0 refills | Status: AC | PRN

## 2017-09-19 MED ORDER — CYCLOBENZAPRINE HCL 5 MG PO TABS: 5.0000 mg | ORAL_TABLET | Freq: Three times a day (TID) | ORAL | 0 refills | Status: AC | PRN

## 2017-09-19 NOTE — ED Triage Notes (Signed)
Pt c/o pain to left shoulder/trapezius muscle.  C/o tight feeling in muscle. Pain with extension of arm; felt a popping in shoulder when did this.  Has been moving (came down from [place]) and thinks hurt then.  Has had a tingly feeling in arm as well.  Started 2 days ago.  No significant medical history.  No fevers.  If leaves arm raised with make the tingling worse.

## 2017-09-19 NOTE — ED Provider Notes (Signed)
[facility] EMERGENCY DEPARTMENT Provider Note   CSN: [id number] Arrival date & time: 09/19/17  1440     History   Chief Complaint Chief Complaint  Patient presents with  . Shoulder Pain    HPI Cathy Woods is a 47 y.o. female presents to the emergency department for evaluation of burning pain and numbness going down her left arm.  Patient states she has been recently moving from [place] down to [facility], been performing a lot of lifting of boxes.  She has no history of neck pain and cervical impingement.  No recent falls or trauma or injury.  Patient states of last 2 days she has had intermittent burning numbness and tingling going down her left trapezius, superior scapular border, triceps, ulnar aspect of the left forearm and into the last 2 digits.  She has been taking over-the-counter anti-inflammatory medications with no improvement.  She denies any weakness in the left upper extremity.  Her pain is currently 4 out of 10 with sitting.  Her symptoms can be increased with cervical extension but alleviated with cervical flexion.  No chest pain, shortness of breath.  HPI  Past Medical History:  Diagnosis Date  . Biliary colic   . Cholecystitis 11/30/2016  . Heart palpitations 2018   unknown etiology, no treatment as she has not seeked help.    Patient Active Problem List   Diagnosis Date Noted  . Cholecystitis 11/30/2016  . Biliary colic     Past Surgical History:  Procedure Laterality Date  . CHOLECYSTECTOMY N/A 11/30/2016   Procedure: LAPAROSCOPIC CHOLECYSTECTOMY;  Surgeon: Henrene Dodge, MD;  Location: [facility] ORS;  Service: General;  Laterality: N/A;  . VAGINAL DELIVERY  2014   nsvd x 4    OB History    No data available       Home Medications    Prior to Admission medications   Medication Sig Start Date End Date Taking? Authorizing Provider  cyclobenzaprine (FLEXERIL) 5 MG tablet Take 1-2 tablets (5-10 mg total) by mouth 3  (three) times daily as needed for muscle spasms. 09/19/17   Evon Slack, PA-C  Multiple Vitamin (MULTIVITAMIN) capsule Take 1 capsule by mouth daily.    [provider]  predniSONE (DELTASONE) 10 MG tablet 10 day taper. 5,5,4,4,3,3,2,2,1,1 09/19/17   Evon Slack, PA-C  simethicone (MYLICON) 80 MG chewable tablet Chew 80 mg by mouth every 6 (six) hours as needed for flatulence.    [provider]  traMADol (ULTRAM) 50 MG tablet Take 1 tablet (50 mg total) by mouth every 6 (six) hours as needed. 09/19/17 09/19/18  Evon Slack, PA-C    Family History Family History  Problem Relation Age of Onset  . Gallbladder disease Mother   . Diabetes Mother   . Hyperlipidemia Mother   . Kidney failure Father   . Diabetes Brother     Social History Social History   Tobacco Use  . Smoking status: Current Every Day Smoker    Packs/day: 1.00    Years: 20.00    Pack years: 20.00  . Smokeless tobacco: Never Used  Substance Use Topics  . Alcohol use: No    Comment: occasional  . Drug use: No     Allergies   Bactrim [sulfamethoxazole-trimethoprim]   Review of Systems Review of Systems  Constitutional: Negative for fever.  Respiratory: Negative for shortness of breath.   Cardiovascular: Negative for chest pain.  Gastrointestinal: Negative for abdominal pain.  Musculoskeletal: Positive for neck  pain. Negative for back pain, myalgias and neck stiffness.  Skin: Negative for rash.  Neurological: Positive for numbness. Negative for dizziness, weakness and headaches.     Physical Exam Updated Vital Signs BP 133/86   Pulse 68   Temp 98.6 F (37 C) (Oral)   Resp 18   Ht 5\' 6"  (1.676 m)   Wt 90.7 kg (200 lb)   LMP 09/14/2017   SpO2 98%   BMI 32.28 kg/m   Physical Exam  Constitutional: She is oriented to person, place, and time. She appears well-developed and well-nourished.  HENT:  Head: Normocephalic and atraumatic.  Eyes: Conjunctivae are normal.    Neck: Normal range of motion.  Cardiovascular: Normal rate.  Pulmonary/Chest: Effort normal. No respiratory distress.  Musculoskeletal:  Cervical Spine: Examination of the cervical spine reveals no bony abnormality, no edema, and no ecchymosis.  There is no step-off.  The patient has full active and passive range of motion of the cervical spine with flexion, extension, and right and left bend with rotation.  Patient has pain with cervical extension reproducing left arm radicular symptoms.  The patient is non-tender along the spinous process to palpation.  The patient has no paravertebral muscle spasm.  Mild superior scapular border tenderness along the left scapula with no muscle spasms noted.  Patient has a positive Spurling's test to the left producing severe radicular symptoms including numbness and tingling along the C8 distribution.      Left upper Extremity: Examination of the left shoulder and arm showed no bony abnormality or edema.  The patient has normal active and passive motion with abduction, flexion, internal rotation, and external rotation.  The patient has no tenderness with motion.  The patient has a negative Hawkins test and a negative impingement test.  The patient has a negative drop arm test.  The patient is non-tender along the deltoid muscle.  There is no subacromial space tenderness with no AC joint tenderness.  The patient has no instability of the shoulder with anterior-posterior motion.  There is a negative sulcus sign.  The rotator cuff muscle strength is 5/5 with supraspinatus, 5/5 with internal rotation, and 5/5 with external rotation.  There is no crepitus with range of motion activities.     Neurological: She is alert and oriented to person, place, and time.  Skin: Skin is warm. No rash noted.  Psychiatric: She has a normal mood and affect. Her behavior is normal. Thought content normal.     ED Treatments / Results  Labs (all labs ordered are listed, but only  abnormal results are displayed) Labs Reviewed - No data to display  EKG  EKG Interpretation None       Radiology No results found.  Procedures Procedures (including critical care time)  Medications Ordered in ED Medications  ketorolac (TORADOL) 30 MG/ML injection 30 mg (not administered)     Initial Impression / Assessment and Plan / ED Course  I have reviewed the triage vital signs and the nursing notes.  Pertinent labs & imaging results that were available during my care of the patient were reviewed by me and considered in my medical decision making (see chart for details).     47 year old female with acute left-sided cervical radiculopathy.  No trauma or injury.  She complains of burning numbness and tingling in left C8 distribution.  Radicular symptoms are intermittent but significantly increased with Spurling's test.  She has no gross weakness or neurological deficits on exam.  Vital signs within normal  limits, no fevers.  She is started on Toradol here in the emergency department to help with inflammation 10-day steroid taper.  She is given Flexeril for intermittent muscle spasms.  Tramadol is given breakthrough pain.  She is educated on signs and symptoms return to the ED for.  She will follow with orthopedics in 10 days if no improvement.  Final Clinical Impressions(s) / ED Diagnoses   Final diagnoses:  Left cervical radiculopathy    ED Discharge Orders        Ordered    predniSONE (DELTASONE) 10 MG tablet     09/19/17 1548    cyclobenzaprine (FLEXERIL) 5 MG tablet  3 times daily PRN     09/19/17 1548    traMADol (ULTRAM) 50 MG tablet  Every 6 hours PRN     09/19/17 1548       Ronnette Juniper 09/19/17 1613    Minna Antis, MD 09/19/17 2258

## 2017-09-19 NOTE — Discharge Instructions (Signed)
Please take prednisone daily as prescribed.  You may use Flexeril as needed for muscle spasms.  Use tramadol as needed for breakthrough pain.  Flexeril and tramadol will make you sleepy, do not drive while taking these 2 medications.  You may take Tylenol for additional pain relief.  If no improvement in 10 days, follow-up with orthopedist.  Return to the emergency department for any increasing pain, weakness, worsening symptoms or urgent changes in your health.

## 2018-02-06 ENCOUNTER — Emergency Department: Payer: Medicaid Other

## 2018-02-06 ENCOUNTER — Emergency Department
Admission: EM | Admit: 2018-02-06 | Discharge: 2018-02-06 | Disposition: A | Discharge status: Home / Self Care | Payer: Medicaid Other | Attending: Emergency Medicine | Admitting: Emergency Medicine

## 2018-02-06 ENCOUNTER — Other Ambulatory Visit: Payer: Self-pay

## 2018-02-06 ENCOUNTER — Encounter: Payer: Self-pay | Admitting: Emergency Medicine

## 2018-02-06 DIAGNOSIS — F17210 Nicotine dependence, cigarettes, uncomplicated: Secondary | ICD-10-CM | POA: Insufficient documentation

## 2018-02-06 DIAGNOSIS — R079 Chest pain, unspecified: Secondary | ICD-10-CM | POA: Diagnosis present

## 2018-02-06 DIAGNOSIS — F419 Anxiety disorder, unspecified: Secondary | ICD-10-CM | POA: Diagnosis not present

## 2018-02-06 DIAGNOSIS — Z79899 Other long term (current) drug therapy: Secondary | ICD-10-CM | POA: Diagnosis not present

## 2018-02-06 HISTORY — DX: Panic disorder (episodic paroxysmal anxiety): F41.0

## 2018-02-06 LAB — BASIC METABOLIC PANEL
Anion gap: 10 (ref 5–15)
BUN: 9 mg/dL (ref 6–20)
CALCIUM: 9.6 mg/dL (ref 8.9–10.3)
CHLORIDE: 112 mmol/L — AB (ref 98–111)
CO2: 18 mmol/L — AB (ref 22–32)
CREATININE: 0.69 mg/dL (ref 0.44–1.00)
GFR calc Af Amer: >60 mL/min (ref >60)
Glucose, Bld: 106 mg/dL — ABNORMAL HIGH (ref 70–99)
Potassium: 3.5 mmol/L (ref 3.5–5.1)
SODIUM: 140 mmol/L (ref 135–145)

## 2018-02-06 LAB — CBC
HCT: 42.3 % (ref 35.0–47.0)
HEMOGLOBIN: 14.3 g/dL (ref 12.0–16.0)
MCH: 28.9 pg (ref 26.0–34.0)
MCHC: 33.9 g/dL (ref 32.0–36.0)
MCV: 85.1 fL (ref 80.0–100.0)
PLATELETS: 243 10*3/uL (ref 150–440)
RBC: 4.97 MIL/uL (ref 3.80–5.20)
RDW: 14.8 % — ABNORMAL HIGH (ref 11.5–14.5)
WBC: 11.4 10*3/uL — AB (ref 3.6–11.0)

## 2018-02-06 LAB — TSH: TSH: 3.575 u[IU]/mL (ref 0.350–4.500)

## 2018-02-06 LAB — TROPONIN I

## 2018-02-06 MED ORDER — LORAZEPAM 1 MG PO TABS: 1.0000 mg | ORAL_TABLET | Freq: Four times a day (QID) | ORAL | 0 refills | Status: AC | PRN

## 2018-02-06 MED ORDER — LORAZEPAM 1 MG PO TABS
1.0000 mg | ORAL_TABLET | Freq: Once | ORAL | Status: AC
  Administered 2018-02-06: 1 mg via ORAL
  Filled 2018-02-06: qty 1

## 2018-02-06 NOTE — ED Notes (Signed)
Pt in with co a warmth that starts in abd and radiates up to chest. States started 3 days ago and feels like a panic attack but states her panic attacks never last this long. Pt has been feeling anxious about a coming up trip, has tried relaxation techniques at home without relief. Pt long hx of anxiety but has never taken meds for the same. States chest discomfort is intermittent with some shob. Does have some pain between scapula that is worse on inspiration, recent cough without fever.

## 2018-02-06 NOTE — ED Provider Notes (Addendum)
[facility] Emergency Department Provider Note  ____________________________________________   I have reviewed the triage vital signs and the nursing notes. Where available I have reviewed prior notes and, if possible and indicated, outside hospital notes.    HISTORY  Chief Complaint Chest Pain; Shortness of Breath; and Anxiety    HPI Cathy Barretto is a 47 y.o. female with a history of panic attacks since she was a teenager and states she is having a panic attack.  She was scheduled to go on a trip with her children, did not go on the trip because she became anxious about traveling with her children and has been anxious ever since.  Her anxiety is been waxing and waning for couple days but never completely gone.Marland Kitchen  No chest pain that is otherwise unexplained aside from her panic attacks.  She has no personal family history of PE or DVT, no recent travel no swelling to her lower extremities, she is been having symptoms now for 3 days this is exactly like multiple prior panic attacks.  She has been using breathing and calming herself down but then she gets anxious again.  She has no SI or HI and she has no thoughts of hurting anyone.  She denies being abused.    Past Medical History:  Diagnosis Date  . Biliary colic   . Cholecystitis 11/30/2016  . Heart palpitations 2018   unknown etiology, no treatment as she has not seeked help.  . Panic attacks     Patient Active Problem List   Diagnosis Date Noted  . Cholecystitis 11/30/2016  . Biliary colic     Past Surgical History:  Procedure Laterality Date  . CHOLECYSTECTOMY N/A 11/30/2016   Procedure: LAPAROSCOPIC CHOLECYSTECTOMY;  Surgeon: Henrene Dodge, MD;  Location: [facility] ORS;  Service: General;  Laterality: N/A;  . VAGINAL DELIVERY  2014   nsvd x 4    Prior to Admission medications   Medication Sig Start Date End Date Taking? Authorizing Provider  cyclobenzaprine (FLEXERIL) 5 MG tablet Take 1-2 tablets  (5-10 mg total) by mouth 3 (three) times daily as needed for muscle spasms. 09/19/17   Evon Slack, PA-C  Multiple Vitamin (MULTIVITAMIN) capsule Take 1 capsule by mouth daily.    [provider]  predniSONE (DELTASONE) 10 MG tablet 10 day taper. 5,5,4,4,3,3,2,2,1,1 09/19/17   Evon Slack, PA-C  simethicone (MYLICON) 80 MG chewable tablet Chew 80 mg by mouth every 6 (six) hours as needed for flatulence.    [provider]  traMADol (ULTRAM) 50 MG tablet Take 1 tablet (50 mg total) by mouth every 6 (six) hours as needed. 09/19/17 09/19/18  Evon Slack, PA-C    Allergies Bactrim [sulfamethoxazole-trimethoprim]  Family History  Problem Relation Age of Onset  . Gallbladder disease Mother   . Diabetes Mother   . Hyperlipidemia Mother   . Kidney failure Father   . Diabetes Brother     Social History Social History   Tobacco Use  . Smoking status: Current Every Day Smoker    Packs/day: 1.00    Years: 20.00    Pack years: 20.00  . Smokeless tobacco: Never Used  Substance Use Topics  . Alcohol use: No    Comment: occasional  . Drug use: No    Review of Systems Constitutional: No fever/chills Eyes: No visual changes. ENT: No sore throat. No stiff neck no neck pain Cardiovascular: Denies chest pain. Respiratory: See HPI Gastrointestinal:   no vomiting.  No diarrhea.  No constipation. Genitourinary: Negative for dysuria. Musculoskeletal: Negative lower extremity swelling Skin: Negative for rash. Neurological: Negative for severe headaches, focal weakness or numbness.   ____________________________________________   PHYSICAL EXAM:  VITAL SIGNS: ED Triage Vitals  Enc Vitals Group     BP 02/06/18 1947 131/86     Pulse Rate 02/06/18 1947 85     Resp 02/06/18 1947 20     Temp 02/06/18 1947 98.6 F (37 C)     Temp Source 02/06/18 1947 Oral     SpO2 02/06/18 1947 100 %     Weight 02/06/18 1948 210 lb (95.3 kg)     Height 02/06/18 1948 5\' 6"   (1.676 m)     Head Circumference --      Peak Flow --      Pain Score 02/06/18 1947 0     Pain Loc --      Pain Edu? --      Excl. in GC? --     Constitutional: Alert and oriented. Well appearing and in no acute distress.  Is very anxious. Eyes: Conjunctivae are normal Head: Atraumatic HEENT: No congestion/rhinnorhea. Mucous membranes are moist.  Oropharynx non-erythematous Neck:   Nontender with no meningismus, no masses, no stridor Cardiovascular: Normal rate, regular rhythm. Grossly normal heart sounds.  Good peripheral circulation. Respiratory: Normal respiratory effort.  No retractions. Lungs CTAB. Abdominal: Soft and nontender. No distention. No guarding no rebound Back:  There is no focal tenderness or step off.  there is no midline tenderness there are no lesions noted. there is no CVA tenderness Musculoskeletal: No lower extremity tenderness, no upper extremity tenderness. No joint effusions, no DVT signs strong distal pulses no edema Neurologic:  Normal speech and language. No gross focal neurologic deficits are appreciated.  Skin:  Skin is warm, dry and intact. No rash noted. Psychiatric: Mood and affect are very very anxious. Speech and behavior are normal.  ____________________________________________   LABS (all labs ordered are listed, but only abnormal results are displayed)  Labs Reviewed  BASIC METABOLIC PANEL - Abnormal; Notable for the following components:      Result Value   Chloride 112 (*)    CO2 18 (*)    Glucose, Bld 106 (*)    All other components within normal limits  CBC - Abnormal; Notable for the following components:   WBC 11.4 (*)    RDW 14.8 (*)    All other components within normal limits  TROPONIN I  TSH  POC URINE PREG, ED    Pertinent labs  results that were available during my care of the patient were reviewed by me and considered in my medical decision making (see chart for  details). ____________________________________________  EKG  I personally interpreted any EKGs ordered by me or triage Sinus rhythm, no acute ST elevation or depression, nonspecific ST changes, no acute ischemia.  Normal axis rate 79 ____________________________________________  RADIOLOGY  Pertinent labs & imaging results that were available during my care of the patient were reviewed by me and considered in my medical decision making (see chart for details). If possible, patient and/or family made aware of any abnormal findings.  Dg Chest 2 View  Result Date: 02/06/2018 CLINICAL DATA:  47 y/o  F; chest tightness and shortness of breath. EXAM: CHEST - 2 VIEW COMPARISON:  11/30/2016 chest radiograph FINDINGS: Stable heart size and mediastinal contours are within normal limits. Both lungs are clear. The visualized skeletal structures are unremarkable. Right upper quadrant cholecystectomy clips. IMPRESSION:  No acute pulmonary process identified. Electronically Signed   By: Mitzi HansenLance  Furusawa-Stratton M.D.   On: 02/06/2018 20:20   ____________________________________________    PROCEDURES  Procedure(s) performed: None  Procedures  Critical Care performed: None  ____________________________________________   INITIAL IMPRESSION / ASSESSMENT AND PLAN / ED COURSE  Pertinent labs & imaging results that were available during my care of the patient were reviewed by me and considered in my medical decision making (see chart for details).  Patient is here complaining of what she describes as a panic attack and I believe that this is probably the correct diagnosis.  She certainly is very anxious she is in the room listening to meditation tapes and trying to calm down.  She states that she has a ride home.  She has no SI or HI, very low suspicion for ACS PE or dissection but nonetheless we did do an EKG which is reassuring chest x-ray which is reassuring and cardiac enzymes which are reassuring,  and after 3 days of symptoms I would suspect that they would show something.  PE is a possibility, but patient does not really have any risk factors for PE and she is describing her classic panic attack.  I do not think a d-dimer or CT scan is indicated in this case.  Patient is in no acute distress medically she is is very anxious.  We will give her p.o. anti-anxiety medications which I think will help and I have sat next to her and on some breathing exercises.  We can calm her down, and make her feel better by these means we will reconsider whether further imaging is necessary but at this time I think is in her best interest not to have it.  Patient in no acute distress otherwise  ----------------------------------------- 10:01 PM on 02/06/2018 -----------------------------------------  After Ativan and breathing exercises all of her symptoms are gone, I do not think pursuing PE is in her best interest at this time.  Patient states that she feels much better and wants to go home and sleep.  She is not driving and counseled not to drive.  I will send her home with a prescription for a few doses of Ativan that she can take if she gets in trouble with panic attacks at last again.  Advised outpatient counseling through PCP and I will also refer her to [facility].  Patient very happy and grateful for care and eager to go home   ____________________________________________   FINAL CLINICAL IMPRESSION(S) / ED DIAGNOSES  Final diagnoses:  None      This chart was dictated using voice recognition software.  Despite best efforts to proofread,  errors can occur which can change meaning.      Jeanmarie PlantMcShane, Tanylah Schnoebelen A, MD 02/06/18 2130    Jeanmarie PlantMcShane, Jossue Rubenstein A, MD 02/06/18 2201

## 2018-02-06 NOTE — ED Triage Notes (Addendum)
Pt presents to ED with c/o worsening chest tightness and sob for the past 3 days. Pt states has a hx of anxiety and can typically calm herself down but was unable to do that this time. Pt reports she was lying in bed at the onset of her symptoms which started with palpitations. Pt appears anxious and has increased work of breathing noted at this time. Skin clammy. Able to answer questions without difficulty.

## 2018-06-18 ENCOUNTER — Encounter: Payer: Self-pay | Admitting: Emergency Medicine

## 2018-06-18 ENCOUNTER — Other Ambulatory Visit: Payer: Self-pay

## 2018-06-18 DIAGNOSIS — R1013 Epigastric pain: Secondary | ICD-10-CM | POA: Insufficient documentation

## 2018-06-18 DIAGNOSIS — Z5321 Procedure and treatment not carried out due to patient leaving prior to being seen by health care provider: Secondary | ICD-10-CM | POA: Diagnosis not present

## 2018-06-18 DIAGNOSIS — R142 Eructation: Secondary | ICD-10-CM | POA: Insufficient documentation

## 2018-06-18 DIAGNOSIS — R110 Nausea: Secondary | ICD-10-CM | POA: Diagnosis not present

## 2018-06-18 MED ORDER — ALUM & MAG HYDROXIDE-SIMETH 200-200-20 MG/5ML PO SUSP
30.0000 mL | Freq: Once | ORAL | Status: AC
  Administered 2018-06-19: 30 mL via ORAL
  Filled 2018-06-18: qty 30

## 2018-06-18 MED ORDER — ONDANSETRON 4 MG PO TBDP
4.0000 mg | ORAL_TABLET | Freq: Once | ORAL | Status: AC
  Administered 2018-06-18: 4 mg via ORAL
  Filled 2018-06-18: qty 1

## 2018-06-18 MED ORDER — LIDOCAINE VISCOUS HCL 2 % MT SOLN
15.0000 mL | Freq: Once | OROMUCOSAL | Status: AC
  Administered 2018-06-19: 15 mL via ORAL
  Filled 2018-06-18: qty 15

## 2018-06-18 NOTE — ED Triage Notes (Addendum)
Pt to triage via w/c, brought in by EMS; pt reports onset mid epigastric pain accomp by nausea upon awakening PTA; denies hx of same; pt st recently has been "burping a lot"; describes pain now as a "burning"

## 2018-06-19 ENCOUNTER — Emergency Department
Admission: EM | Admit: 2018-06-19 | Discharge: 2018-06-19 | Disposition: A | Discharge status: Home / Self Care | Payer: Medicaid Other | Attending: Emergency Medicine | Admitting: Emergency Medicine

## 2018-06-19 LAB — URINALYSIS, COMPLETE (UACMP) WITH MICROSCOPIC
Bacteria, UA: NONE SEEN
Bilirubin Urine: NEGATIVE
GLUCOSE, UA: NEGATIVE mg/dL
Ketones, ur: NEGATIVE mg/dL
Leukocytes, UA: NEGATIVE
Nitrite: NEGATIVE
PH: 7 (ref 5.0–8.0)
PROTEIN: NEGATIVE mg/dL
Specific Gravity, Urine: 1.012 (ref 1.005–1.030)

## 2018-06-19 LAB — COMPREHENSIVE METABOLIC PANEL
ALT: 21 U/L (ref 0–44)
AST: 27 U/L (ref 15–41)
Albumin: 3.8 g/dL (ref 3.5–5.0)
Alkaline Phosphatase: 67 U/L (ref 38–126)
Anion gap: 9 (ref 5–15)
BILIRUBIN TOTAL: 0.4 mg/dL (ref 0.3–1.2)
BUN: 11 mg/dL (ref 6–20)
CO2: 19 mmol/L — ABNORMAL LOW (ref 22–32)
CREATININE: 0.74 mg/dL (ref 0.44–1.00)
Calcium: 8.6 mg/dL — ABNORMAL LOW (ref 8.9–10.3)
Chloride: 109 mmol/L (ref 98–111)
GFR calc Af Amer: >60 mL/min (ref >60)
GLUCOSE: 105 mg/dL — AB (ref 70–99)
Potassium: 3.5 mmol/L (ref 3.5–5.1)
Sodium: 137 mmol/L (ref 135–145)
Total Protein: 7.1 g/dL (ref 6.5–8.1)

## 2018-06-19 LAB — TROPONIN I

## 2018-06-19 LAB — CBC WITH DIFFERENTIAL/PLATELET
ABS IMMATURE GRANULOCYTES: 0.04 10*3/uL (ref 0.00–0.07)
Basophils Absolute: 0 10*3/uL (ref 0.0–0.1)
Basophils Relative: 0 %
EOS PCT: 2 %
Eosinophils Absolute: 0.2 10*3/uL (ref 0.0–0.5)
HCT: 40.1 % (ref 36.0–46.0)
Hemoglobin: 13.1 g/dL (ref 12.0–15.0)
Immature Granulocytes: 0 %
LYMPHS ABS: 3.3 10*3/uL (ref 0.7–4.0)
LYMPHS PCT: 34 %
MCH: 28.2 pg (ref 26.0–34.0)
MCHC: 32.7 g/dL (ref 30.0–36.0)
MCV: 86.2 fL (ref 80.0–100.0)
MONOS PCT: 5 %
Monocytes Absolute: 0.4 10*3/uL (ref 0.1–1.0)
NEUTROS ABS: 5.7 10*3/uL (ref 1.7–7.7)
NEUTROS PCT: 59 %
NRBC: 0 % (ref 0.0–0.2)
Platelets: 286 10*3/uL (ref 150–400)
RBC: 4.65 MIL/uL (ref 3.87–5.11)
RDW: 14.7 % (ref 11.5–15.5)
WBC: 9.8 10*3/uL (ref 4.0–10.5)

## 2018-06-19 LAB — LIPASE, BLOOD: LIPASE: 39 U/L (ref 11–51)

## 2018-06-19 LAB — POCT PREGNANCY, URINE: Preg Test, Ur: NEGATIVE

## 2018-06-19 NOTE — ED Notes (Signed)
Pt not in lobby when called.  Pt called because she had an iv in her arm.  Phone line busy.  Called several times without success.

## 2018-07-31 ENCOUNTER — Other Ambulatory Visit: Payer: Self-pay | Admitting: Family Medicine

## 2018-07-31 DIAGNOSIS — Z1231 Encounter for screening mammogram for malignant neoplasm of breast: Secondary | ICD-10-CM

## 2018-08-25 ENCOUNTER — Emergency Department: Payer: Medicaid Other

## 2018-08-25 ENCOUNTER — Emergency Department
Admission: EM | Admit: 2018-08-25 | Discharge: 2018-08-25 | Disposition: A | Discharge status: Home / Self Care | Payer: Medicaid Other | Attending: Emergency Medicine | Admitting: Emergency Medicine

## 2018-08-25 ENCOUNTER — Encounter: Payer: Self-pay | Admitting: Emergency Medicine

## 2018-08-25 DIAGNOSIS — S6991XA Unspecified injury of right wrist, hand and finger(s), initial encounter: Secondary | ICD-10-CM

## 2018-08-25 DIAGNOSIS — Y9289 Other specified places as the place of occurrence of the external cause: Secondary | ICD-10-CM | POA: Insufficient documentation

## 2018-08-25 DIAGNOSIS — S62636A Displaced fracture of distal phalanx of right little finger, initial encounter for closed fracture: Secondary | ICD-10-CM

## 2018-08-25 DIAGNOSIS — S67196A Crushing injury of right little finger, initial encounter: Secondary | ICD-10-CM | POA: Diagnosis not present

## 2018-08-25 DIAGNOSIS — Y9389 Activity, other specified: Secondary | ICD-10-CM | POA: Insufficient documentation

## 2018-08-25 DIAGNOSIS — S62666A Nondisplaced fracture of distal phalanx of right little finger, initial encounter for closed fracture: Secondary | ICD-10-CM | POA: Diagnosis not present

## 2018-08-25 DIAGNOSIS — F17210 Nicotine dependence, cigarettes, uncomplicated: Secondary | ICD-10-CM | POA: Insufficient documentation

## 2018-08-25 DIAGNOSIS — S6710XA Crushing injury of unspecified finger(s), initial encounter: Secondary | ICD-10-CM

## 2018-08-25 DIAGNOSIS — Y998 Other external cause status: Secondary | ICD-10-CM | POA: Insufficient documentation

## 2018-08-25 DIAGNOSIS — W230XXA Caught, crushed, jammed, or pinched between moving objects, initial encounter: Secondary | ICD-10-CM | POA: Diagnosis not present

## 2018-08-25 MED ORDER — IBUPROFEN 800 MG PO TABS
800.0000 mg | ORAL_TABLET | Freq: Once | ORAL | Status: AC
  Administered 2018-08-25: 800 mg via ORAL
  Filled 2018-08-25: qty 1

## 2018-08-25 MED ORDER — HYDROCODONE-ACETAMINOPHEN 5-325 MG PO TABS: 1.0000 | ORAL_TABLET | Freq: Four times a day (QID) | ORAL | 0 refills | Status: AC | PRN

## 2018-08-25 MED ORDER — HYDROCODONE-ACETAMINOPHEN 5-325 MG PO TABS
1.0000 | ORAL_TABLET | Freq: Once | ORAL | Status: AC
  Administered 2018-08-25: 1 via ORAL
  Filled 2018-08-25: qty 1

## 2018-08-25 MED ORDER — IBUPROFEN 800 MG PO TABS: 800.0000 mg | ORAL_TABLET | Freq: Three times a day (TID) | ORAL | 0 refills | Status: AC | PRN

## 2018-08-25 NOTE — Discharge Instructions (Addendum)
1.  You may take pain medicines as needed (Motrin/Norco #15). 2.  Wear splint as needed for comfort. 3.  Return to the ER for worsening symptoms, persistent vomiting, difficulty breathing or other concerns.

## 2018-08-25 NOTE — ED Provider Notes (Signed)
[facility] Emergency Department Provider Note   ____________________________________________   First MD Initiated Contact with Patient 08/25/18 [phone]     (approximate)  I have reviewed the triage vital signs and the nursing notes.   HISTORY  Chief Complaint Finger Injury    HPI Cathy Woods is a 48 y.o. female who presents to the ED from home with a chief complaint of RIGHT pinky finger injury.  Patient reports she got her right pinky finger caught in the door of her truck.  She is right-hand dominant.  Presents with pain and swelling to the distal finger.  Voices no other complaints or injuries.    Past Medical History:  Diagnosis Date  . Biliary colic   . Cholecystitis 11/30/2016  . Heart palpitations 2018   unknown etiology, no treatment as she has not seeked help.  . Panic attacks     Patient Active Problem List   Diagnosis Date Noted  . Cholecystitis 11/30/2016  . Biliary colic     Past Surgical History:  Procedure Laterality Date  . CHOLECYSTECTOMY N/A 11/30/2016   Procedure: LAPAROSCOPIC CHOLECYSTECTOMY;  Surgeon: Henrene Dodge, MD;  Location: [facility] ORS;  Service: General;  Laterality: N/A;  . VAGINAL DELIVERY  2014   nsvd x 4    Prior to Admission medications   Medication Sig Start Date End Date Taking? Authorizing Provider  cyclobenzaprine (FLEXERIL) 5 MG tablet Take 1-2 tablets (5-10 mg total) by mouth 3 (three) times daily as needed for muscle spasms. 09/19/17   Evon Slack, PA-C  Multiple Vitamin (MULTIVITAMIN) capsule Take 1 capsule by mouth daily.    [provider]  predniSONE (DELTASONE) 10 MG tablet 10 day taper. 5,5,4,4,3,3,2,2,1,1 09/19/17   Evon Slack, PA-C  simethicone (MYLICON) 80 MG chewable tablet Chew 80 mg by mouth every 6 (six) hours as needed for flatulence.    [provider]  traMADol (ULTRAM) 50 MG tablet Take 1 tablet (50 mg total) by mouth every 6 (six) hours as needed. 09/19/17  09/19/18  Evon Slack, PA-C    Allergies Bactrim [sulfamethoxazole-trimethoprim]  Family History  Problem Relation Age of Onset  . Gallbladder disease Mother   . Diabetes Mother   . Hyperlipidemia Mother   . Kidney failure Father   . Diabetes Brother     Social History Social History   Tobacco Use  . Smoking status: Current Every Day Smoker    Packs/day: 1.00    Years: 20.00    Pack years: 20.00  . Smokeless tobacco: Never Used  Substance Use Topics  . Alcohol use: No    Comment: occasional  . Drug use: No    Review of Systems  Constitutional: No fever/chills Eyes: No visual changes. ENT: No sore throat. Cardiovascular: Denies chest pain. Respiratory: Denies shortness of breath. Gastrointestinal: No abdominal pain.  No nausea, no vomiting.  No diarrhea.  No constipation. Genitourinary: Negative for dysuria. Musculoskeletal: Positive for right fifth digit pain and swelling.  Negative for back pain. Skin: Negative for rash. Neurological: Negative for headaches, focal weakness or numbness.   ____________________________________________   PHYSICAL EXAM:  VITAL SIGNS: ED Triage Vitals [08/25/18 0007]  Enc Vitals Group     BP (!) 165/103     Pulse Rate 100     Resp 18     Temp 98.7 F (37.1 C)     Temp Source Oral     SpO2 97 %     Weight 210 lb (95.3 kg)  Height 5\' 6"  (1.676 m)     Head Circumference      Peak Flow      Pain Score 0     Pain Loc      Pain Edu?      Excl. in GC?     Constitutional: Alert and oriented. Well appearing and in no acute distress. Eyes: Conjunctivae are normal. PERRL. EOMI. Head: Atraumatic. Nose: No congestion/rhinnorhea. Mouth/Throat: Mucous membranes are moist.  Oropharynx non-erythematous. Neck: No stridor.  No cervical spine tenderness to palpation. Cardiovascular: Normal rate, regular rhythm. Grossly normal heart sounds.  Good peripheral circulation. Respiratory: Normal respiratory effort.  No retractions.  Lungs CTAB. Gastrointestinal: Soft and nontender. No distention. No abdominal bruits. No CVA tenderness. Musculoskeletal:  Right fifth digit: Distal aspect bruised and swollen.  Limited range of motion secondary to pain.  No lacerations.  No nailbed injury.  2+ radial pulse.  Brisk, less than 5-second capillary refill. Neurologic:  Normal speech and language. No gross focal neurologic deficits are appreciated. No gait instability. Skin:  Skin is warm, dry and intact. No rash noted. Psychiatric: Mood and affect are normal. Speech and behavior are normal.  ____________________________________________   LABS (all labs ordered are listed, but only abnormal results are displayed)  Labs Reviewed - No data to display ____________________________________________  EKG  None ____________________________________________  RADIOLOGY  ED MD interpretation: Right fifth distal phalanx fracture  Official radiology report(s): Dg Finger Little Right  Result Date: 08/25/2018 CLINICAL DATA:  Initial evaluation for acute trauma. EXAM: RIGHT LITTLE FINGER 2+V COMPARISON:  None. FINDINGS: There is an acute oblique mildly comminuted nondisplaced fracture through the right fifth distal phalanx. No intra-articular extension. Overlying soft tissue swelling. IMPRESSION: Acute oblique mildly comminuted nondisplaced fracture through the mid aspect of the right fifth distal phalanx. Electronically Signed   By: Rise Mu M.D.   On: 08/25/2018 04:52    ____________________________________________   PROCEDURES  Procedure(s) performed: None  Procedures  Critical Care performed: No  ____________________________________________   INITIAL IMPRESSION / ASSESSMENT AND PLAN / ED COURSE  As part of my medical decision making, I reviewed the following data within the electronic MEDICAL RECORD NUMBER Nursing notes reviewed and incorporated, Radiograph reviewed and Notes from prior ED visits    48 year old  female who presents with right fifth digit injury.  Distal phalanx fracture seen on x-ray.  Will place finger in splint, administer NSAIDs, analgesia and she will follow-up with orthopedics.  Strict return precautions given.  Patient verbalizes understanding and agrees with plan of care.      ____________________________________________   FINAL CLINICAL IMPRESSION(S) / ED DIAGNOSES  Final diagnoses:  Injury of finger of right hand, initial encounter  Crushing injury of finger, initial encounter  Closed nondisplaced fracture of distal phalanx of right little finger, initial encounter     ED Discharge Orders    None       Note:  This document was prepared using Dragon voice recognition software and may include unintentional dictation errors.   Irean Hong, MD 08/25/18 [phone]

## 2018-08-25 NOTE — ED Triage Notes (Signed)
Pt with left 5th digit injury after pt got it caught in car door tonight. Bruising and swelling noted.

## 2019-04-26 IMAGING — US US ABDOMEN LIMITED
1 series · 14 of 25 positions shown · non-contrast
Comparison: None.

CLINICAL DATA: Acute onset of right upper quadrant abdominal pain.
Initial encounter.

EXAM:
US ABDOMEN LIMITED - RIGHT UPPER QUADRANT

[Series 1: us abdomen limited · 0.23mm/px · 14 of 42 slices shown]
[im 1/42]
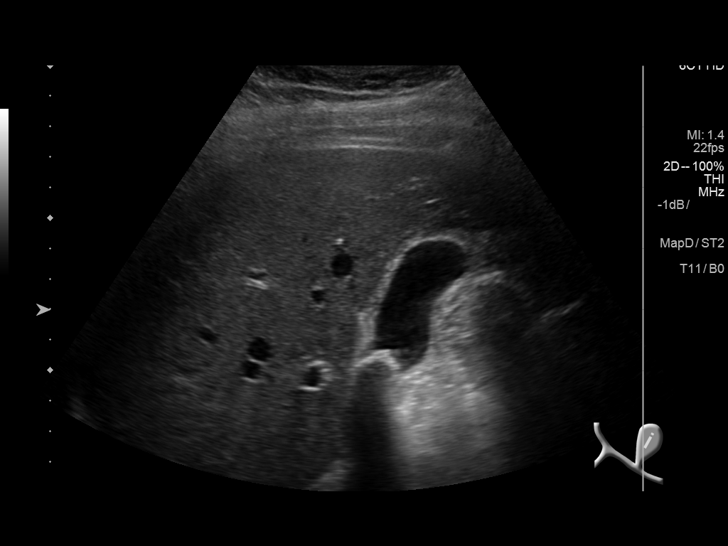
[im 4/42]
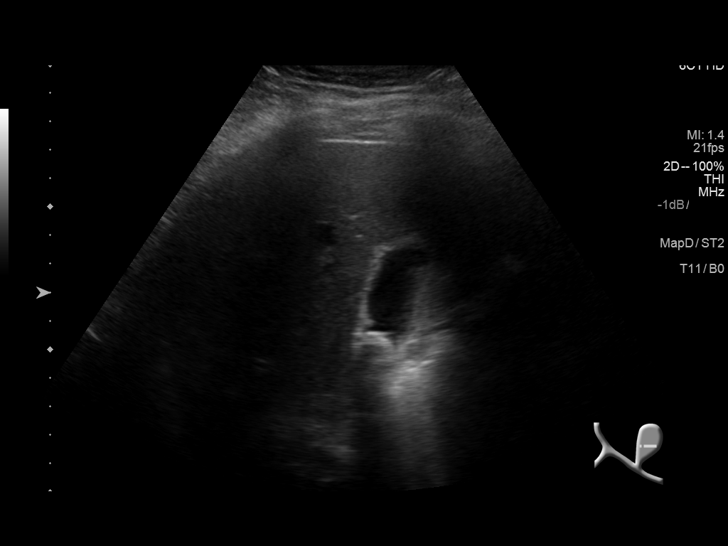
[im 7/42]
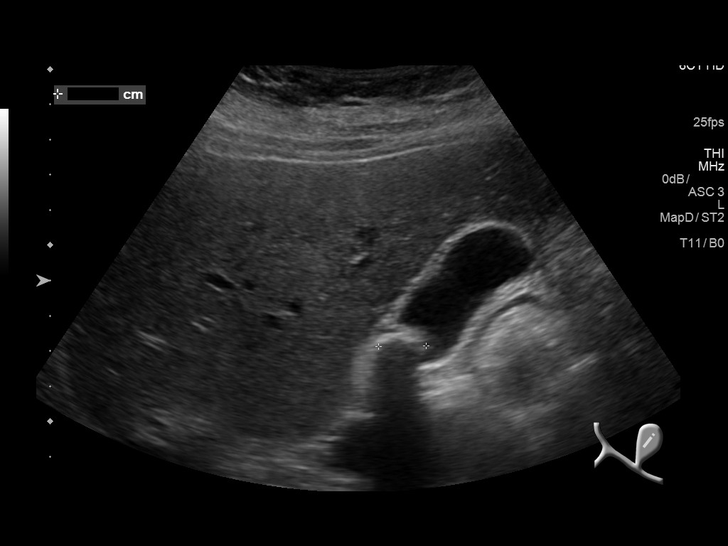
[im 11/42]
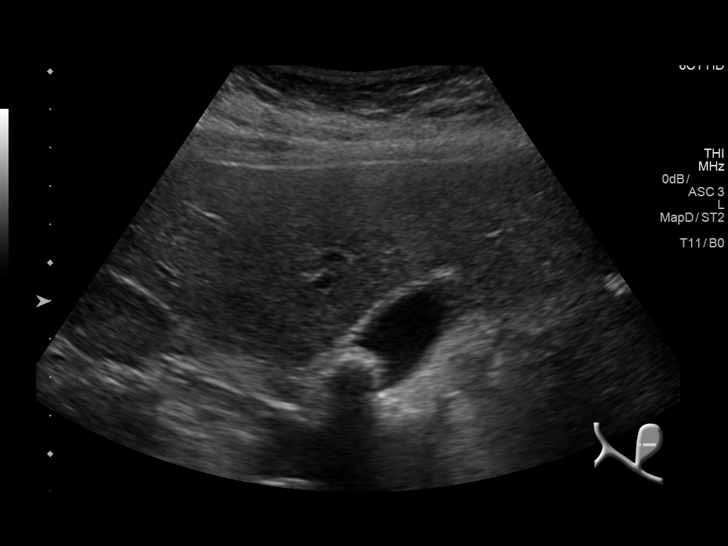
[im 14/42]
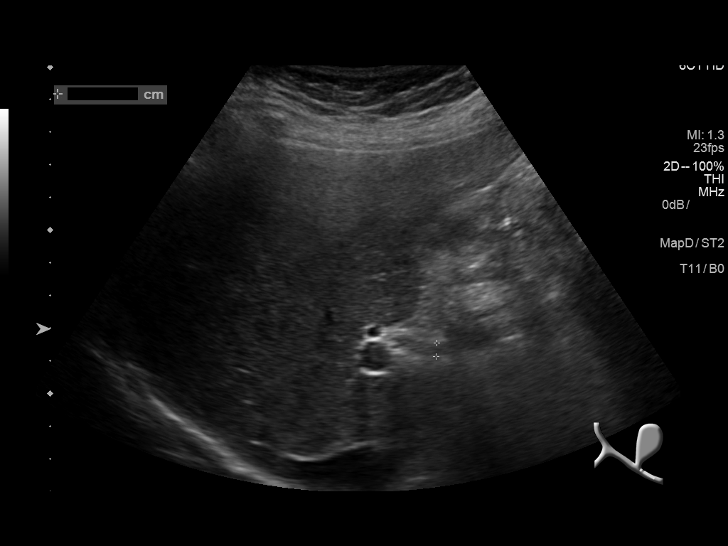
[im 16/42]
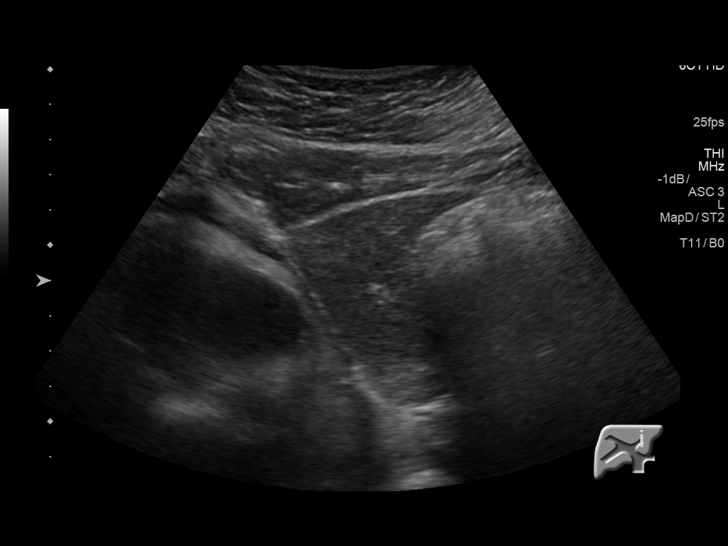
[im 19/42]
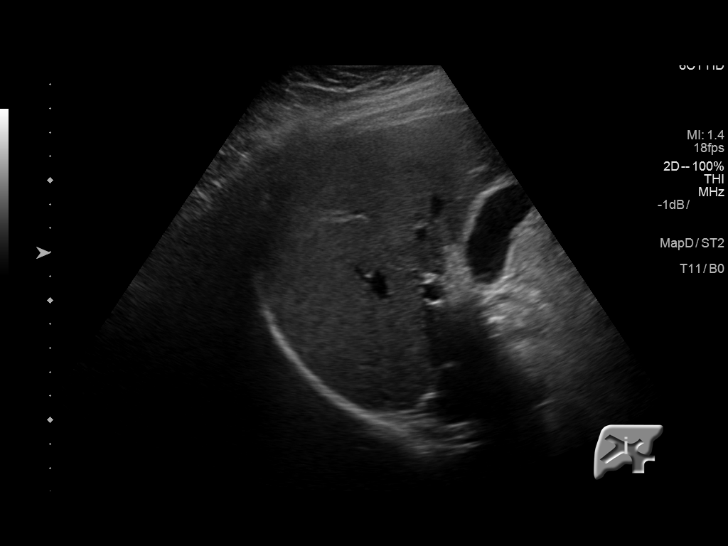
[im 23/42]
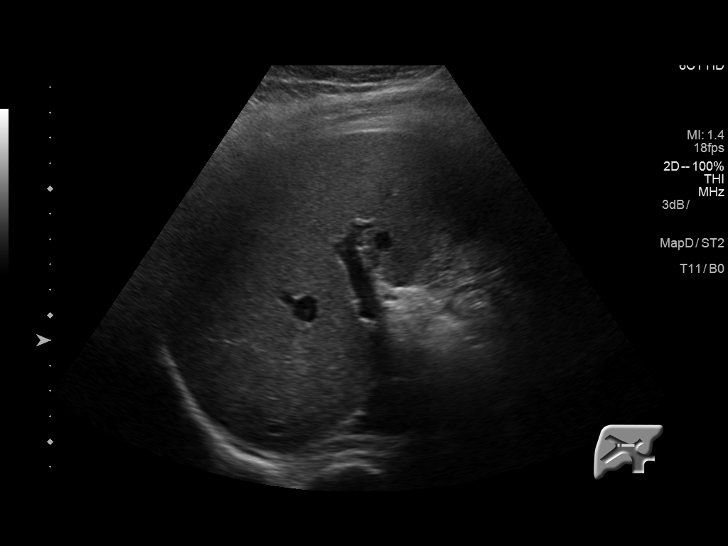
[im 26/42]
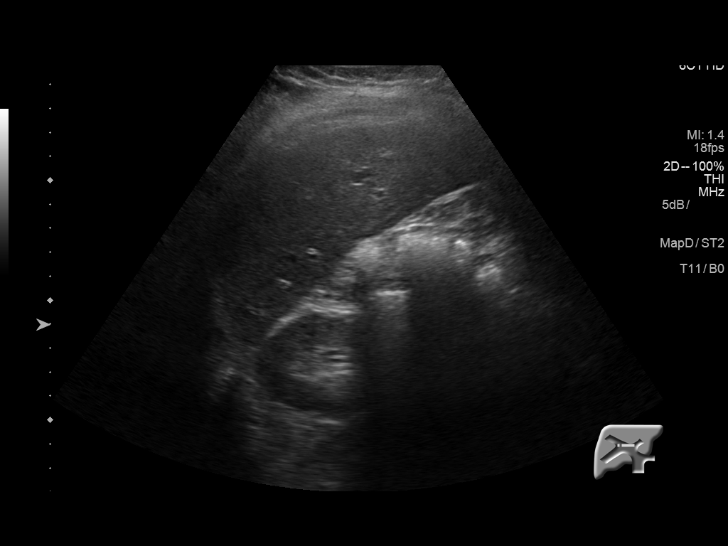
[im 28/42]
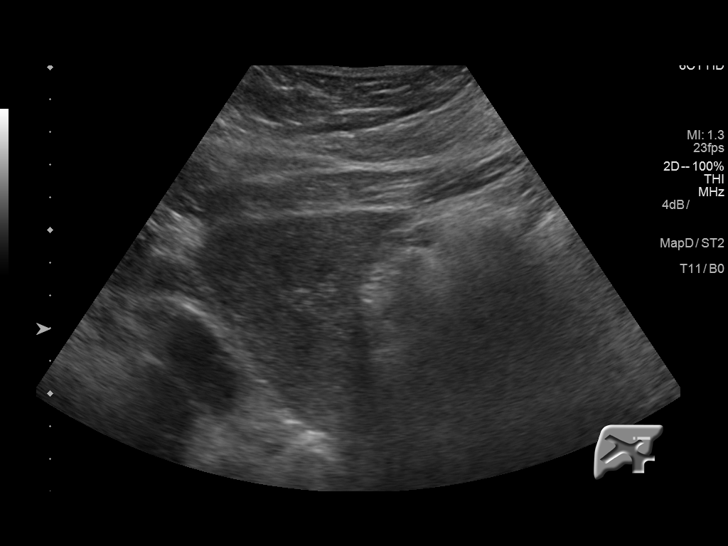
[im 31/42]
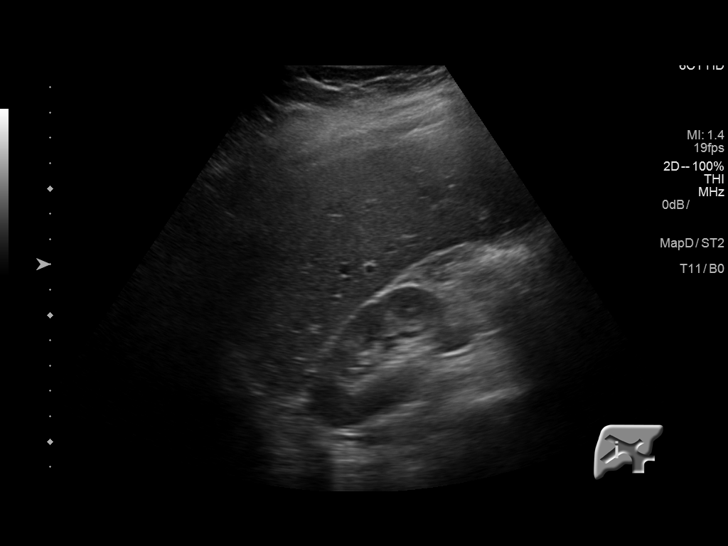
[im 35/42]
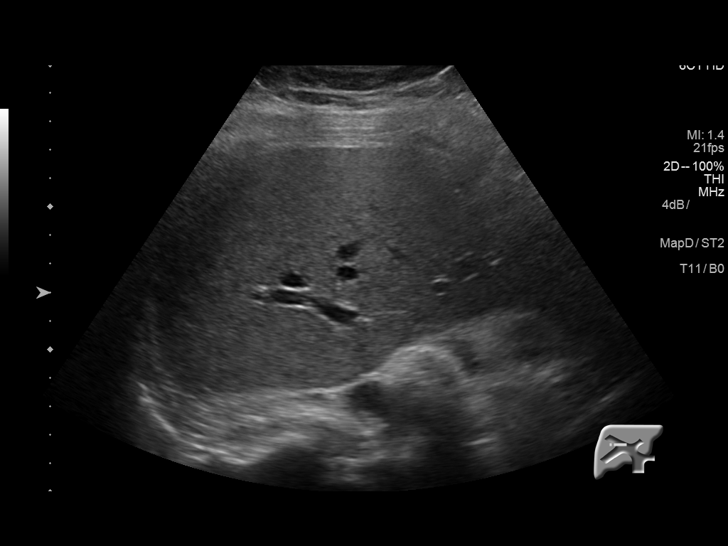
[im 38/42]
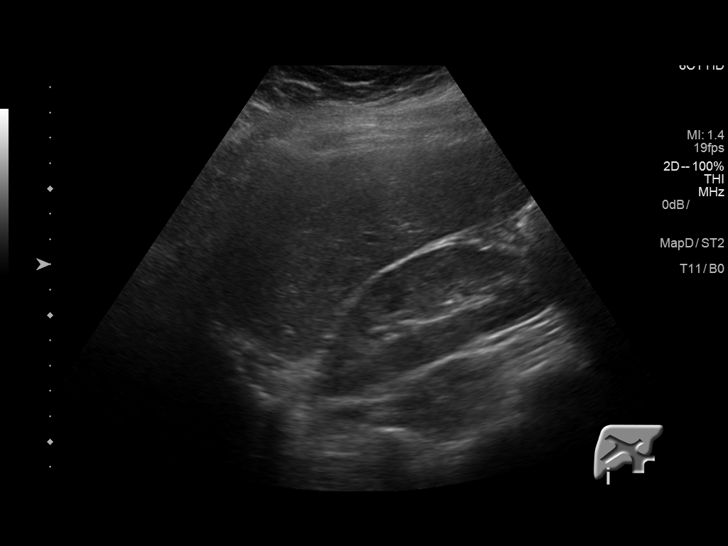
[im 42/42]
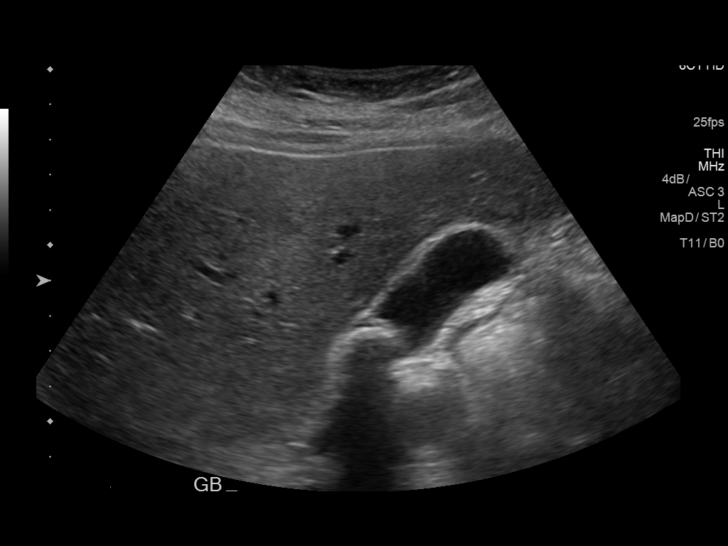

[14 of 25 positions shown; findings below may reference images not displayed]

FINDINGS: Gallbladder:

A 1.3 cm stone is lodged at the neck of the gallbladder. Mild
gallbladder wall thickening is noted, measuring 3 mm. A positive
ultrasonographic Murphy's sign is elicited, raising concern for
intermittent obstruction due to the stone. No pericholecystic fluid
is seen.

Common bile duct:

Diameter: 0.5 cm, within normal limits in caliber.

Liver:

No focal lesion identified. Within normal limits in parenchymal
echogenicity.
IMPRESSION: 1.3 cm stone lodged at the neck of the gallbladder. Mild gallbladder
wall thickening noted. Positive ultrasonographic Murphy's sign
raises concern for intermittent obstruction due to the stone. No
definite evidence for acute cholecystitis.

## 2019-08-28 IMAGING — CR DG CHEST 2V
2 series · 2 of 2 positions shown · non-contrast
Comparison: 11/30/2016 chest radiograph

CLINICAL DATA: 46 y/o  F; chest tightness and shortness of breath.

EXAM:
CHEST - 2 VIEW

[chest pa]
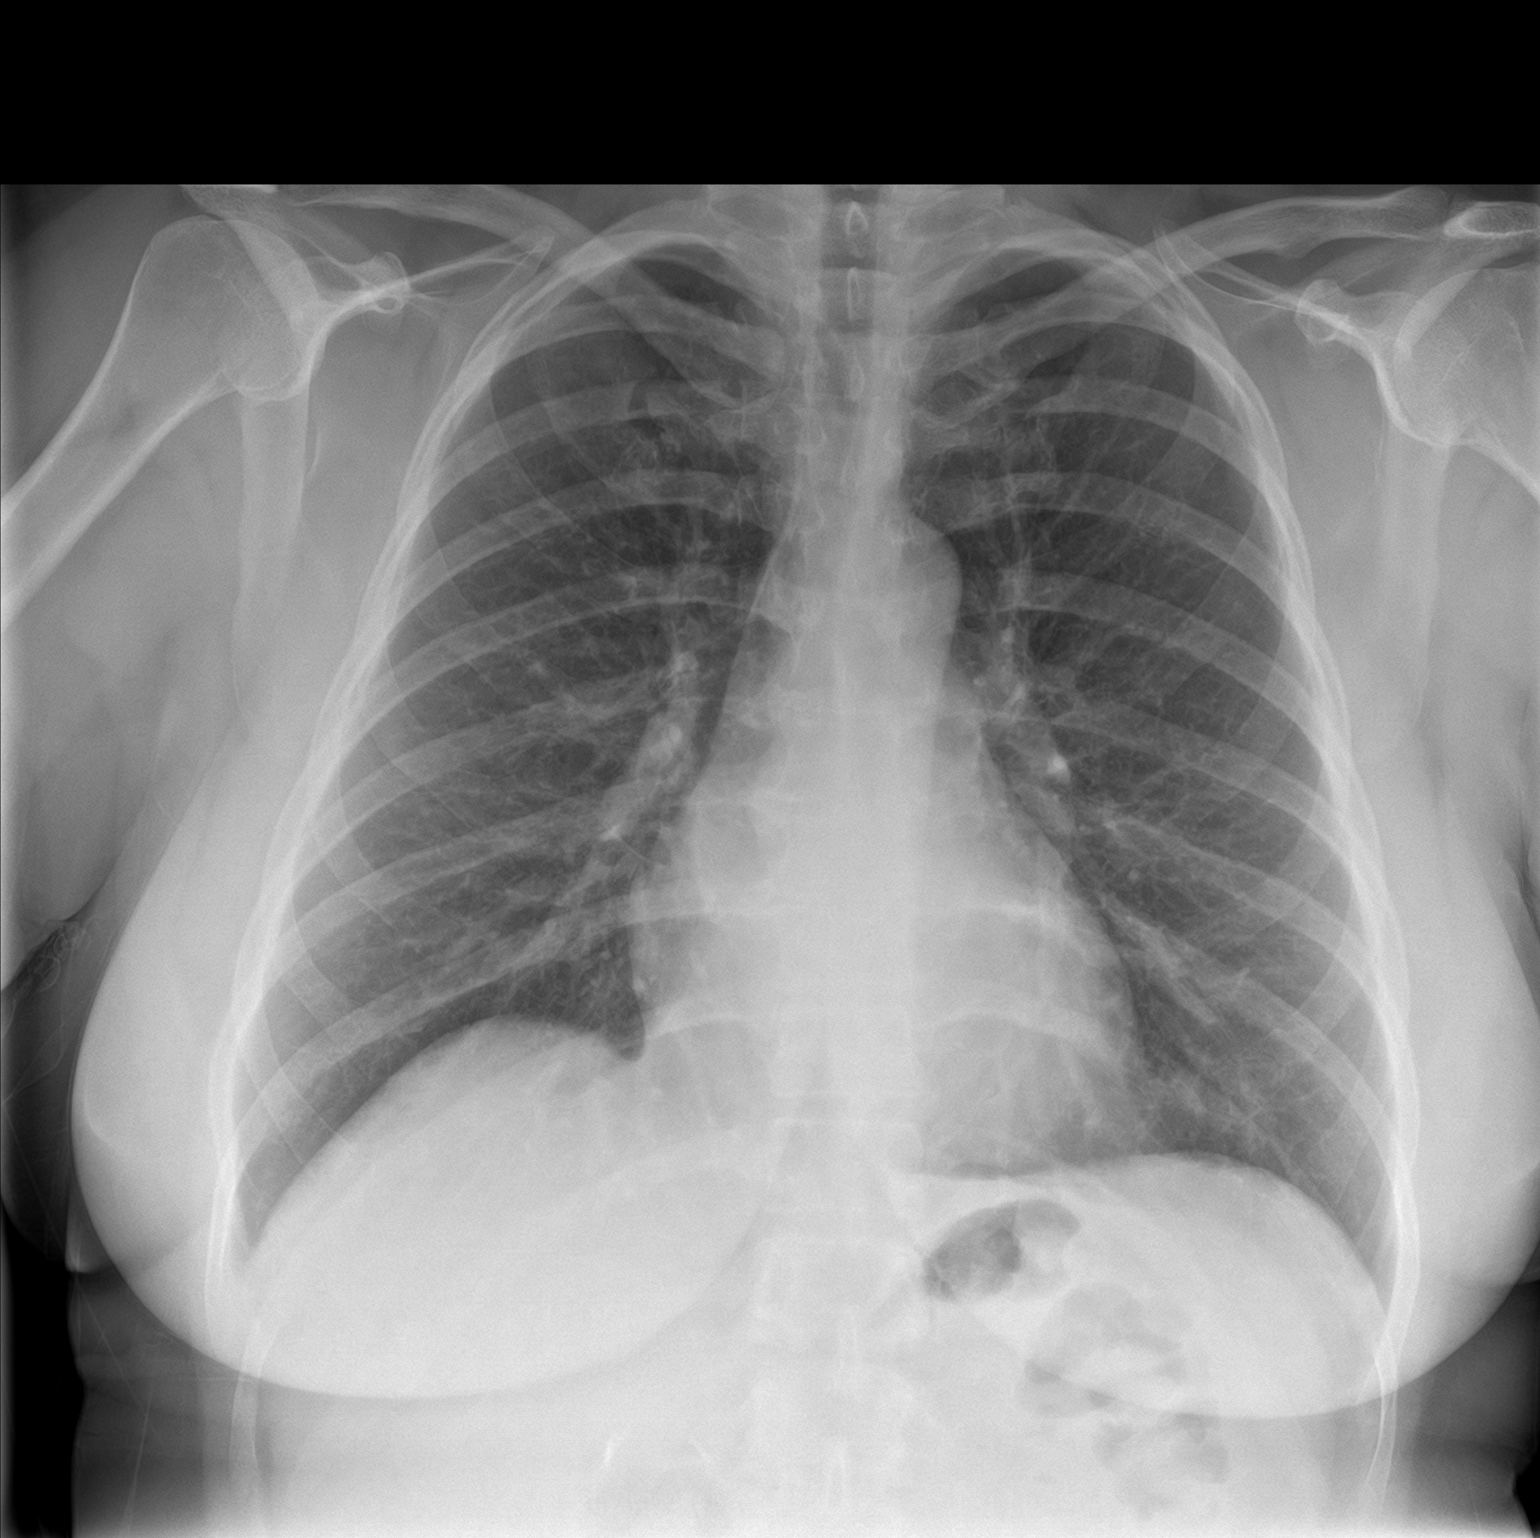

[chest lat]
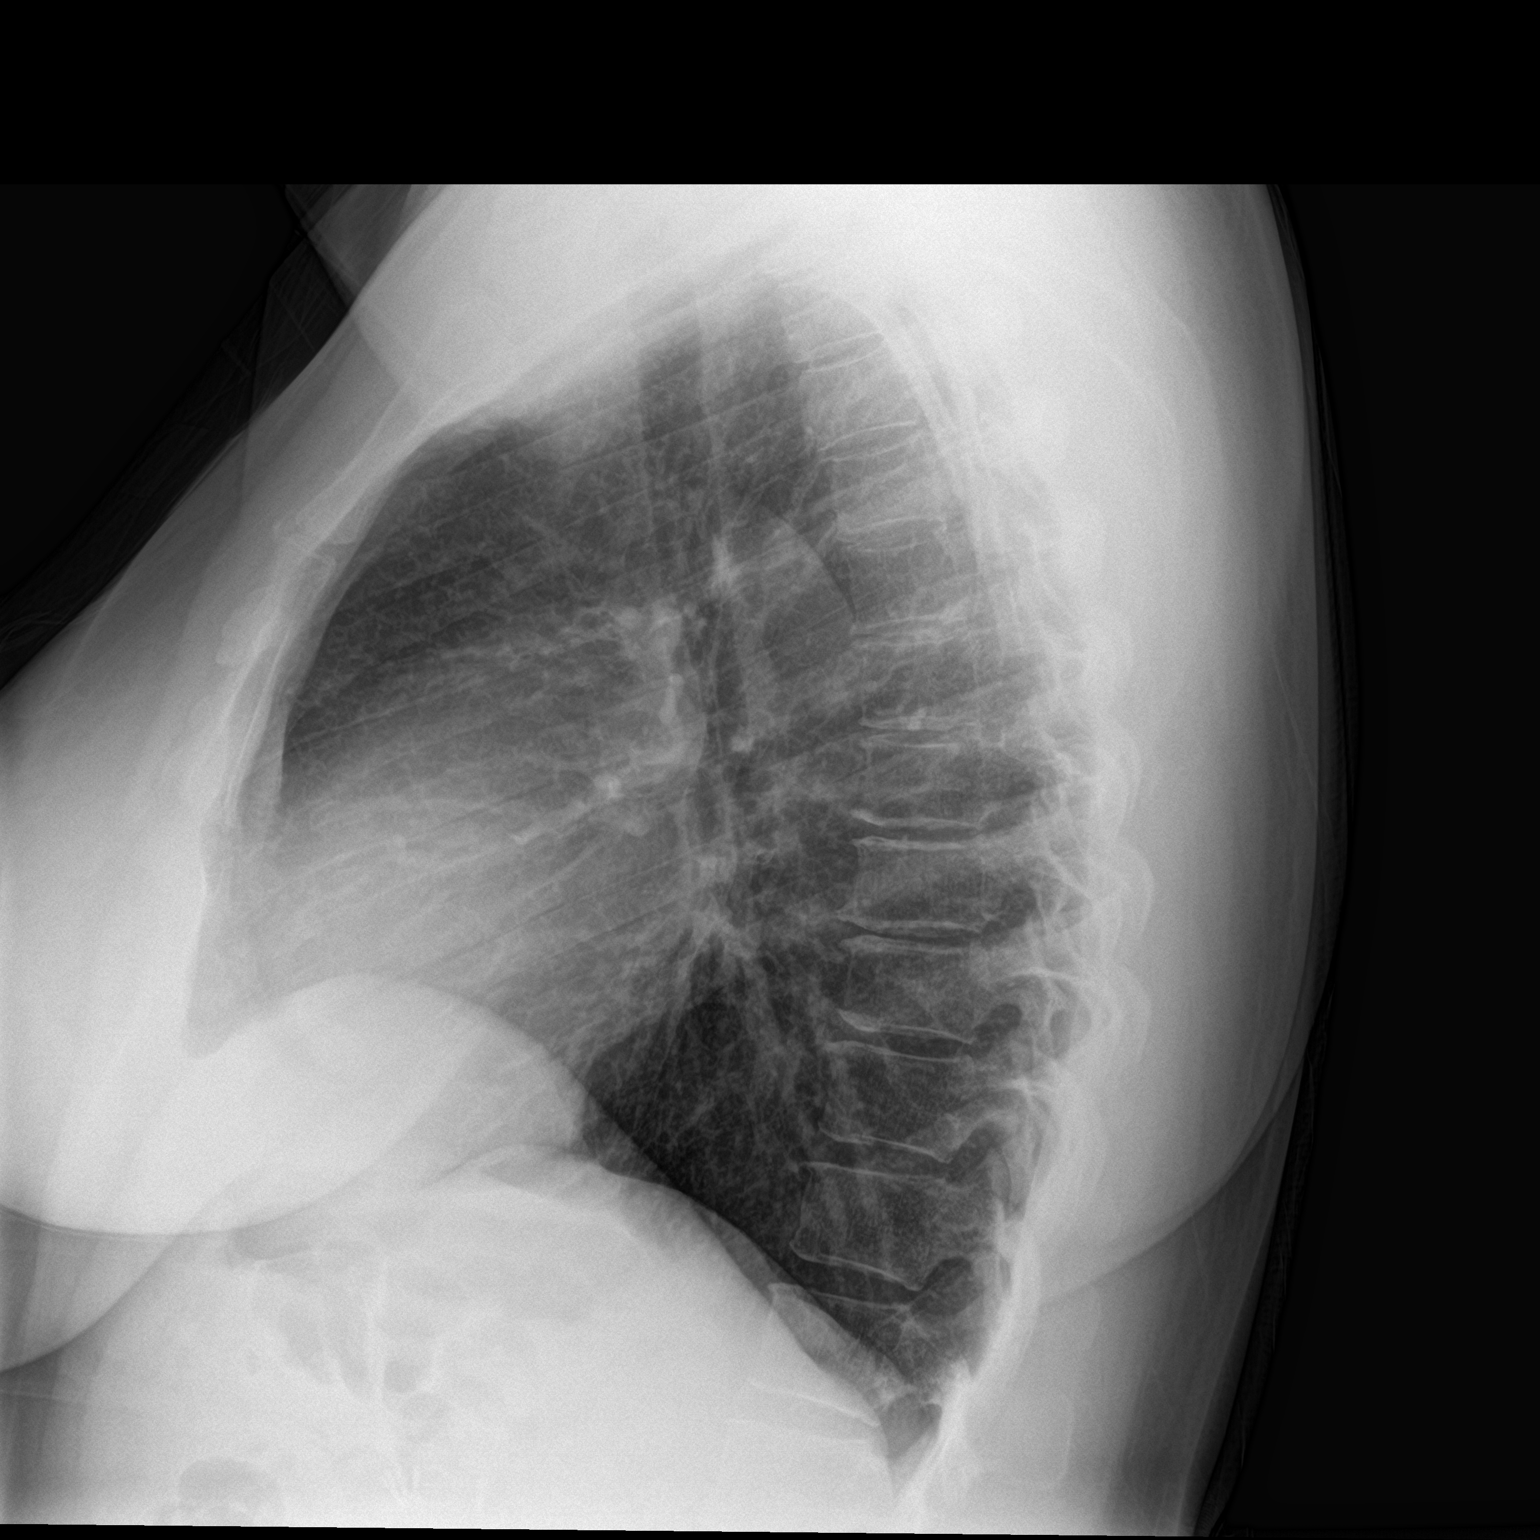

[2 of 2 positions shown; findings below may reference images not displayed]

FINDINGS: Stable heart size and mediastinal contours are within normal limits.
Both lungs are clear. The visualized skeletal structures are
unremarkable. Right upper quadrant cholecystectomy clips.
IMPRESSION: No acute pulmonary process identified.

By: Vanaa Bokan M.D.
